# Patient Record
Sex: Female | Born: 1955 | Race: White | Hispanic: No | Marital: Married | Smoking: Never smoker
Health system: Southern US, Community
[De-identification: ages and names within clinical notes are randomized; demographics above are authoritative.]

## PROBLEM LIST (undated history)

## (undated) DIAGNOSIS — F419 Anxiety disorder, unspecified: Secondary | ICD-10-CM

## (undated) DIAGNOSIS — R739 Hyperglycemia, unspecified: Secondary | ICD-10-CM

## (undated) DIAGNOSIS — E785 Hyperlipidemia, unspecified: Secondary | ICD-10-CM

## (undated) DIAGNOSIS — I10 Essential (primary) hypertension: Secondary | ICD-10-CM

## (undated) HISTORY — DX: Anxiety disorder, unspecified: F41.9

## (undated) HISTORY — DX: Hyperlipidemia, unspecified: E78.5

## (undated) HISTORY — DX: Essential (primary) hypertension: I10

## (undated) HISTORY — DX: Hyperglycemia, unspecified: R73.9

---

## 2004-02-01 ENCOUNTER — Ambulatory Visit: Payer: Self-pay | Admitting: Family Medicine

## 2004-06-27 ENCOUNTER — Ambulatory Visit (HOSPITAL_COMMUNITY): Admission: RE | Admit: 2004-06-27 | Discharge: 2004-06-27 | Payer: Self-pay | Admitting: *Deleted

## 2004-06-27 ENCOUNTER — Encounter (INDEPENDENT_AMBULATORY_CARE_PROVIDER_SITE_OTHER): Payer: Self-pay | Admitting: *Deleted

## 2005-01-29 ENCOUNTER — Emergency Department (HOSPITAL_COMMUNITY): Admission: EM | Admit: 2005-01-29 | Discharge: 2005-01-29 | Payer: Self-pay | Admitting: Emergency Medicine

## 2005-02-19 ENCOUNTER — Ambulatory Visit: Payer: Self-pay | Admitting: Family Medicine

## 2006-03-23 ENCOUNTER — Ambulatory Visit: Payer: Self-pay

## 2006-10-13 ENCOUNTER — Ambulatory Visit: Payer: Self-pay | Admitting: Family Medicine

## 2006-11-03 ENCOUNTER — Ambulatory Visit: Payer: Self-pay | Admitting: Family Medicine

## 2006-11-30 DIAGNOSIS — N61 Mastitis without abscess: Secondary | ICD-10-CM | POA: Insufficient documentation

## 2006-12-09 ENCOUNTER — Encounter: Admission: RE | Admit: 2006-12-09 | Discharge: 2006-12-09 | Payer: Self-pay | Admitting: Obstetrics

## 2006-12-17 ENCOUNTER — Ambulatory Visit: Payer: Self-pay | Admitting: Family Medicine

## 2007-05-25 ENCOUNTER — Ambulatory Visit (HOSPITAL_COMMUNITY): Admission: RE | Admit: 2007-05-25 | Discharge: 2007-05-26 | Payer: Self-pay | Admitting: Obstetrics

## 2007-05-25 ENCOUNTER — Encounter (INDEPENDENT_AMBULATORY_CARE_PROVIDER_SITE_OTHER): Payer: Self-pay | Admitting: Obstetrics

## 2007-05-26 ENCOUNTER — Encounter (INDEPENDENT_AMBULATORY_CARE_PROVIDER_SITE_OTHER): Payer: Self-pay | Admitting: Obstetrics

## 2007-11-16 DIAGNOSIS — E1169 Type 2 diabetes mellitus with other specified complication: Secondary | ICD-10-CM | POA: Insufficient documentation

## 2007-11-16 DIAGNOSIS — E785 Hyperlipidemia, unspecified: Secondary | ICD-10-CM | POA: Insufficient documentation

## 2008-04-06 HISTORY — PX: ABDOMINAL HYSTERECTOMY: SHX81

## 2008-11-02 DIAGNOSIS — I152 Hypertension secondary to endocrine disorders: Secondary | ICD-10-CM | POA: Insufficient documentation

## 2008-11-05 DIAGNOSIS — F430 Acute stress reaction: Secondary | ICD-10-CM | POA: Insufficient documentation

## 2010-08-19 NOTE — Op Note (Signed)
NAMEAIRIS, Monica Hoffman               ACCOUNT NO.:  [id number]   MEDICAL RECORD NO.:  [id number]          PATIENT TYPE:  AMB   LOCATION:  SDC                           FACILITY:  [facility]   PHYSICIAN:  Lendon Colonel, MD   DATE OF BIRTH:  10-Sep-1955   DATE OF PROCEDURE:  05/25/2007  DATE OF DISCHARGE:                               OPERATIVE REPORT   PREOPERATIVE DIAGNOSES:  1. Pelvic pain.  2. Hematometra.  3. Hematosalpinx.   POSTOPERATIVE DIAGNOSES:  1. Pelvic pain.  2. Hematometra.  3. Hematosalpinx.   PROCEDURE:  Laparoscopic supracervical hysterectomy, bilateral  salpingectomy, lysis of adhesions.   SURGEON:  Dr. Lendon Colonel.   ASSISTANT:  Dr. Earlene Plater.   ANESTHESIA:  General.   FINDINGS:  Bilateral adnexa adhered to pelvic sidewall, cervical  stenosis, blood-filled uterus, bilateral hematosalpinx, normal ovaries.   SPECIMENS:  Uterus, bilateral fallopian tubes.   DISPOSITION:  To pathology.   ESTIMATED BLOOD LOSS:  100 mL.   COMPLICATIONS:  None.   DESCRIPTION OF PROCEDURE:  After informed consent was obtained, the  patient was taken to the operating room where general anesthesia was  initiated with difficulty, prepped and draped in the normal sterile  fashion in the dorsal spine lithotomy position. A Foley catheter was  inserted into the bladder. Attempts at placing uterine manipulators was  unsuccessful secondary to dense adhesions at the internal cervical os.  Approximately 5 minutes were spent in attempting to dilate the cervix  however, this was unsuccessful. The decision was made to proceed with  the surgery without uterine manipulator. We also decided to delay the  decision to  proceed with either  a laparoscopic supracervical  hysterectomy versus total laparoscopic hysterectomy further into the  case and anatomy could be better assessed. Gloves were changed. The 1%  lidocaine was infused in the umbilicus and a 10-mm skin incision was  made with the  scalpel. This was carried through to the underlying layer  of fascia. The fascia was grasped with Kochers x2 and entered with the  Metzenbaums. The Hasson trocar was inserted under direct visualization  into the peritoneal cavity. The uterine peritoneum was insufflated with  CO2 gas to 15 mmHg. A 5-mm skin incision was made in the right lower  quadrant and a 10-mm skin incision was made in the left lower quadrant  with first infusing 1% lidocaine. Trocar placement was made under direct  visualization with the laparoscope. Blunt and grasping probes were used  to aid in visualization of the patient's abdomen and pelvis. Of note, a  normal appendix was noted, normal liver edge, normal gallbladder, no  bowel pathology. Large bilateral distended fallopian tubes were noted  with normal ovaries. The uterus was noted to be adhered to the posterior  cul-de-sac and the bilateral adnexa adhered to the pelvic sidewall.  Monopolar scissors were used to free the adhesions of the uterus to the  posterior cul-de-sac. A decision was made to first proceed with the  right salpingectomy given the degree of tubal distention which was  limiting visualization of the pelvis. The tripolar device was  used to  serially cauterize and transect the right tube from its attachment to  the uterus and to the right ovary. Good hemostasis was noted at that  area. The left round ligament was grasped with a tripolar, transected  and dissection was carried along the anterior leaf of the broad ligament  until a bladder flap was created. Posterior dissection was done as well.  The fallopian tube was transected close to the left cornua and the plan  was to remove the left tube at the end of the procedure. The right round  ligament was grasped and serially transected with the tripolar cautery.  The anterior leaf of the broad ligament was dissected and carried down  to the bladder flap and posterior dissection was done. The left  uterine  artery was dissected out inferiorly, cauterized and transected with the  bipolar s. The procedure was carried down on the right uterine artery.  Dissection down the cardinal ligament was done with the tripolar and the  bladder was cleared with monopolar scissors. Once the uterus was noted  to have blanched and all blood supply was secure, the monopolar scissors  were used to cauterize across the mid portion of the cervix. Good  hemostasis was noted and the uterus was free. The monopolar scissors  were then used in the left pelvic sidewall to free the tube and ovary  which was adhesed to the pelvic sidewall. During this process, the  peritoneal inclusion cysts were ruptured. The left tube was serially  dissected off with the tripolar and freed, good hemostasis was noted.  Once all the specimens were freed and their vascular supply was  controlled, the morcellator was placed in the left side port and the  uterus and bilateral tubes were morcellated. Of note during  morcellation, both hematosalpinx and hematometra were noted. Some  additional bleeding was noted at the right apex of the cervical cuff and  this was controlled with tripolar cautery. Once hemostatic, the pelvis  was irrigated, all pedicles were noted to be free. The morcellator was  removed from the abdomen, the 11 mm left side port was closed with the  laparoscopic fascial closure device with the #0 Vicryl suture. The skin  was closed with 4-0 Vicryl and Dermabond. The patient was awoken from  general anesthesia having tolerated the procedure well.      Lendon Colonel, MD  Electronically Signed     KAF/MEDQ  D:  05/25/2007  T:  05/26/2007  Job:  [id number]

## 2010-08-22 NOTE — Consult Note (Signed)
Monica Hoffman, Monica Hoffman               ACCOUNT NO.:  [id number]   MEDICAL RECORD NO.:  [id number]          PATIENT TYPE:  EMS   LOCATION:  MAJO                         FACILITY:  [facility]   PHYSICIAN:  Genene Churn. Love, M.D.    DATE OF BIRTH:  October 14, 1955   DATE OF CONSULTATION:  01/29/2005  DATE OF DISCHARGE:                                   CONSULTATION   REASON FOR CONSULTATION:  This 55 year old left hand white married female is  seen at the request of Dr. Jacinto Halim for evaluation of numbness.   HISTORY OF PRESENT ILLNESS:  [name] has a past history of D&C in April  2006 treated with hormone replacement therapy following the D&C.  She has no  known history of hypertension, diabetes, or heart disease.  This day, she  awoke in the morning with jaw pain and left shoulder pain followed by  numbness of the left hand and left foot.  This lasted approximately three  hours.  There was no associated headache, syncope, or seizure.  She has no  known history of hypertension, but was found to be hypertensive when she  came to the emergency room  with diastolics in the 116 to 96 range.  She has  been on birth control pills but has no other real risk factors.  She does  not smoke cigarettes.  Her migraine headaches in the past have decreased in  frequency, now occurring about once per month.  They are not associated with  numbness but are associated with photophobia and phonophobia.  She does not  drink alcohol.  She has no history of head or neck trauma.   CURRENT MEDICATIONS:  Birth control pills.   SOCIAL HISTORY:  She works as a Teacher, adult education and she has had some neck  pain in the past radiating to her left arm.   PHYSICAL EXAMINATION:  GENERAL:  Well developed pleasant white female in no acute distress.  VITAL SIGNS:  Blood pressure in right and left arm 140/80, heart rate 84 and  regular, there were no bruits.  NECK:  Flexion and extension maneuvers were unremarkable.  NEUROLOGICAL:   Mental status exam reveals she is alert ands oriented x 3,  she followed one, two, and three step commands.  Cranial nerve examination  reveals visual fields to be full, both discs were seen and flat, the  extraocular movements are full, corneals are present, there was no seventh  nerve palsy.  The tongue was midline, uvula midline, gag was present.  Sternocleidomastoid and trapezius testing were intact.  Motor examination  revealed 5/5 strength in the upper and lower extremities.  Coordination  testing was normal.  Sensory examination was intact to pin prick, light  touch, vibration testing.  Deep tendon reflexes were 1-2+ and plantar  responses were downgoing.   The patient has had two sets of cardiac isoenzymes which are unremarkable  and has had an MRI study of the brain with and without contrast enhancement  which is unremarkable and normal intracranial MRA.   IMPRESSION:  Numbness, 782.0, possibly representing TIA.   PLAN:  Recommend further TIA workup, I discussed with the patient which she  wishes apparently to do as an outpatient.  After talking with Dr. Jacinto Halim, she  will have a stress test, Doppler study, she will gon one one aspirin per day  and discontinue hormone therapy.           ______________________________  Genene Churn. Sandria Manly, M.D.     JML/MEDQ  D:  01/29/2005  T:  01/30/2005  Job:  [id number]

## 2010-08-22 NOTE — Op Note (Signed)
[name], Hoffman               ACCOUNT NO.:  [id number]   MEDICAL RECORD NO.:  [id number]          PATIENT TYPE:  AMB   LOCATION:  SDC                           [facility]   PHYSICIAN:  Pershing Cox, M.D.DATE OF BIRTH:  1955/06/22   DATE OF PROCEDURE:  06/27/2004  DATE OF DISCHARGE:                                 OPERATIVE REPORT   PREOPERATIVE DIAGNOSES:  1.  Menorrhagia.  2.  Intraluminal myoma.   POSTOPERATIVE DIAGNOSES:  1.  Menorrhagia.  2.  Intraluminal uterine myoma.  3.  Endometrial polyps.   PROCEDURES:  1.  Examination under anesthesia.  2.  Fractional dilatation and curettage.  3.  Hysteroscopy with resection of intraluminal myoma and two endometrial      polyps.  4.  NovaSure endometrial ablation.   SURGEON:  Pershing Cox, M.D.   ASSISTANT:  None.   ANESTHESIA:  General by LMA and local paracervical block.   INDICATIONS FOR PROCEDURE:  Monica Hoffman has bee followed in our office for  gynecologic care.  When seen in September, she was having some left lower  quadrant pain and bleeding almost constantly since June.  She has been  placed on oral contraceptives by Dr. Sullivan Lone in [place].  She came to see  [place] and an ultrasound was performed.  This ultrasound showed uterine myomas  but specifically one intraluminal or submucosal myoma measuring  approximately 1.7 x 1.5 x 1.25.  He wanted to begin on oral contraceptives  to see if this would stop her bleeding.  She was given information regarding  the need for resection at that time.  She has been on [place]  contraceptive but she had had lots of bleeding and decided in February to  proceed with resection of this intraluminal myoma and, if possible,  experience an endometrial ablation.  She is here today for this procedure.   OPERATIVE FINDINGS:  Exam under anesthesia shows the uterus to be  retroverted and the fibroids our actually palpable as a firm surface.  There  are no adnexal  masses.  The cervix sounded to 4 cm.  The endometrial canal  sounded to 8 cm.  There was a 2 cm discrete myoma from the right anterior  endometrium and a small polyp in the left cornua.  In addition, there was a  bloody polyp in the distal or lower uterine segment and a very slender polyp  which was see arising from the top of the endocervix.  At the end of the  procedure, the endometrial cavity had been coagulated at the fundus.   PROCEDURE:  Monica Hoffman was brought to the operating room with an IV in  place.  She had received a gram of Ancef at the holding area.  Supine on the  OR table, general anesthesia was established by IV sedation and then  placement of an LMA mask.  The patient was placed into Allen stirrups and  positioned for the procedure.  Betadine was used to prep the anterior  abdominal wall, upper thighs, perineum and vagina.  The patient was draped for a sterile  vaginal procedure.   Bivalve speculum was inserted into the vagina.  The cervix was visualized  and grasped with a single-tooth tenaculum.  Marcaine paracervical block was  administered at the 3, 4, 7 and 8 position and then on the anterior cervix  beneath the tenaculum.  Endocervical curettings were obtained onto a Telfa.  The Hegar dilator was then used to sound the endocervical canal to 4 cm.  A  uterine sound passed to a depth of 8 cm and these measurements were then  used for the NovaSure ablation.  Serial Pratt dilators were used to dilate  the cervix to size 35 Pratt dilator.  I was only able to dilate to size 31  and then placed another single-tooth tenaculum on the posterior cervix and  was able to further dilate size 35. Using through-and-through sorbitol  irrigation, the endometrial cavity was visualized and photographed. The mass  which was seen in the right cornu was photographed.  Using the resectoscope,  the endometrial polyps were resected.  A forceps was placed into the cavity  to try and remove  them.  Using this, the intraluminal myoma was actually  removed.  The resectoscope was reintroduced and the base of the myoma as  well as the bases of the polyps were excised.  The more distal slender  endocervical polyp was also resected.  In order to distend the endometrial  cavity, the pressure had to be increased to 100 with sorbitol irrigation.  The cavity was visualized and there were no additional abnormalities.   A decision was made to proceed with the NovaSure endometrial ablation.  Saline was attached to the resectoscope and 30 seconds of through-and-  through irrigation were performed to remove the sorbitol in the cavity.   Using the previously-determined cervical depth and endometrial depth, the  cavity was set at 4 cm.  Holding the front of the NovaSure instrument, it was advanced into the  cavity and then backed out 2 cm.  The handle was closed to position the  array and locked.  The device was seated by using a push-pull, up-down, left-  right and then rotational procedure.  The width the array was 3 cm.  The  tenaculum was held throughout the procedure.  Once the device had been  seated and the setting set to 3 on the device, the collar was situated next  to the cervix and we tested to be sure there was no evidence of perforation.  Once the test had been completed and satisfactory, the ablation procedure  was performed.  It took 1 minute 30 seconds to complete this procedure at a  power of 66.  The color was pulled back, the array was separated and  determined that the array was closed.  It was removed.   The hysteroscope was introduced into the cavity and using saline irrigation,  I was able to see that the upper fundus had been ablated.  With this  information, the procedure was terminated after the sound passed again only  to 8 cm.      MAJ/MEDQ  D:  06/27/2004  T:  06/27/2004  Job:  [id number]   cc:   Julieanne Manson  [address], [address]  [place]  [address]  Fax: [phone]

## 2010-12-26 LAB — TYPE AND SCREEN
ABO/RH(D): B POS
Antibody Screen: NEGATIVE

## 2010-12-26 LAB — CBC
HCT: 33.8 — ABNORMAL LOW
HCT: 42.5
Hemoglobin: 11.8 — ABNORMAL LOW
Hemoglobin: 14.5
MCHC: 34.2
MCHC: 34.9
MCV: 87
MCV: 87.5
Platelets: 186
Platelets: 218
RBC: 3.89
RBC: 4.86
RDW: 13.5
RDW: 13.7
WBC: 14.2 — ABNORMAL HIGH
WBC: 9

## 2010-12-26 LAB — PREGNANCY, URINE: Preg Test, Ur: NEGATIVE

## 2010-12-26 LAB — BASIC METABOLIC PANEL
BUN: 10
CO2: 27
Calcium: 9.6
Chloride: 101
Creatinine, Ser: 0.84
GFR calc Af Amer: >60
GFR calc non Af Amer: >60
Glucose, Bld: 101 — ABNORMAL HIGH
Potassium: 3.2 — ABNORMAL LOW
Sodium: 137

## 2010-12-26 LAB — ABO/RH: ABO/RH(D): B POS

## 2014-10-29 ENCOUNTER — Other Ambulatory Visit: Payer: Self-pay | Admitting: Family Medicine

## 2014-10-29 DIAGNOSIS — I10 Essential (primary) hypertension: Secondary | ICD-10-CM

## 2014-10-29 MED ORDER — VERAPAMIL HCL ER 240 MG PO TBCR: 240.0000 mg | EXTENDED_RELEASE_TABLET | Freq: Every day | ORAL | Status: DC

## 2015-05-01 ENCOUNTER — Other Ambulatory Visit: Payer: Self-pay | Admitting: Family Medicine

## 2015-05-01 DIAGNOSIS — I10 Essential (primary) hypertension: Secondary | ICD-10-CM

## 2015-05-01 MED ORDER — VERAPAMIL HCL ER 240 MG PO TBCR: 240.0000 mg | EXTENDED_RELEASE_TABLET | Freq: Every day | ORAL | Status: DC

## 2015-05-29 ENCOUNTER — Other Ambulatory Visit: Payer: Self-pay | Admitting: Family Medicine

## 2015-05-29 DIAGNOSIS — I10 Essential (primary) hypertension: Secondary | ICD-10-CM

## 2015-05-29 MED ORDER — VERAPAMIL HCL ER 240 MG PO TBCR: 240.0000 mg | EXTENDED_RELEASE_TABLET | Freq: Every day | ORAL | Status: DC

## 2015-06-28 ENCOUNTER — Other Ambulatory Visit: Payer: Self-pay | Admitting: Family Medicine

## 2015-06-28 DIAGNOSIS — I10 Essential (primary) hypertension: Secondary | ICD-10-CM

## 2015-06-28 MED ORDER — VERAPAMIL HCL ER 240 MG PO TBCR: 240.0000 mg | EXTENDED_RELEASE_TABLET | Freq: Every day | ORAL | Status: DC

## 2015-07-01 ENCOUNTER — Telehealth: Payer: Self-pay | Admitting: Family Medicine

## 2015-07-01 NOTE — Telephone Encounter (Signed)
Let patient know Nadine CountsBob sent refill of Calan-SR (verapamil) to the pharmacy on 06-28-15. Should check to see if pharmacy received it. Since he has not seen this patient in over a year, he may need specific tests that he will order during office visit.

## 2015-07-01 NOTE — Telephone Encounter (Signed)
Advised patient as below.  

## 2015-07-01 NOTE — Telephone Encounter (Signed)
Patient is requesting enough refill for verapamil (CALAN-SR) 240 MG CR tablet until her appt with Nadine CountsBob on April 4  [facility]  Cb# [phone]  Also wanted to know if you will order bloodwork and when she come in results will be back

## 2015-07-08 ENCOUNTER — Ambulatory Visit
Admission: RE | Admit: 2015-07-08 | Discharge: 2015-07-08 | Disposition: A | Discharge status: Home / Self Care | Payer: BLUE CROSS/BLUE SHIELD | Source: Ambulatory Visit | Attending: Family Medicine | Admitting: Family Medicine

## 2015-07-08 ENCOUNTER — Ambulatory Visit (INDEPENDENT_AMBULATORY_CARE_PROVIDER_SITE_OTHER): Payer: BLUE CROSS/BLUE SHIELD | Admitting: Family Medicine

## 2015-07-08 ENCOUNTER — Encounter: Payer: Self-pay | Admitting: Family Medicine

## 2015-07-08 VITALS — BP 132/88 | HR 76 | Temp 98.0°F | Resp 16 | Ht 68.0 in | Wt 212.2 lb

## 2015-07-08 DIAGNOSIS — M25531 Pain in right wrist: Secondary | ICD-10-CM

## 2015-07-08 DIAGNOSIS — E119 Type 2 diabetes mellitus without complications: Secondary | ICD-10-CM | POA: Insufficient documentation

## 2015-07-08 DIAGNOSIS — M542 Cervicalgia: Secondary | ICD-10-CM | POA: Diagnosis not present

## 2015-07-08 DIAGNOSIS — R739 Hyperglycemia, unspecified: Secondary | ICD-10-CM | POA: Insufficient documentation

## 2015-07-08 DIAGNOSIS — E785 Hyperlipidemia, unspecified: Secondary | ICD-10-CM | POA: Diagnosis not present

## 2015-07-08 DIAGNOSIS — M25519 Pain in unspecified shoulder: Secondary | ICD-10-CM | POA: Diagnosis present

## 2015-07-08 DIAGNOSIS — I10 Essential (primary) hypertension: Secondary | ICD-10-CM

## 2015-07-08 DIAGNOSIS — F419 Anxiety disorder, unspecified: Secondary | ICD-10-CM | POA: Insufficient documentation

## 2015-07-08 DIAGNOSIS — M47892 Other spondylosis, cervical region: Secondary | ICD-10-CM | POA: Diagnosis not present

## 2015-07-08 DIAGNOSIS — H6980 Other specified disorders of Eustachian tube, unspecified ear: Secondary | ICD-10-CM | POA: Insufficient documentation

## 2015-07-08 DIAGNOSIS — S199XXA Unspecified injury of neck, initial encounter: Secondary | ICD-10-CM | POA: Diagnosis not present

## 2015-07-08 MED ORDER — CLONAZEPAM 0.5 MG PO TABS: ORAL_TABLET | ORAL | Status: DC

## 2015-07-08 NOTE — Progress Notes (Signed)
Subjective:     Patient ID: Monica Hoffman, female   DOB: 05/03/1955, 60 y.o.   MRN: [id number]  HPI  Chief Complaint  Patient presents with  . Hypertension    Patient comes into office today for follow up, last office visit was 04/23/14. At last visit patient was advised to continue Verapamil HCI ER 240mg , patient reports good compliance and tolerance while on medication  . Hyperlipidemia    Follow up from 04/23/14  . Fall    Patient reports that she slipped and had landed on her right arm. Patient reports incident happened in Feb. but has had constant pain in arm, starting  at wrist and radiating up to neck. Patient describes pain as a shooting pain.  Also wishes to renew clonazepam for occasional anxiety-did not get prior rx filled last year. States she has fallen twice over the last few months after tripping. Once on her right shoulder and last on her outstretched wrist. Reports persistent wrist pain as well as intermittent numbness in her right 3-5 fingers. Has persistent right sided neck pain which prohibits her from sleeping comfortably.   Review of Systems  Constitutional:       Encouraged her to schedule a mammogram and to walk 30 minutes daily.  Respiratory: Negative for shortness of breath.   Cardiovascular: Negative for chest pain and palpitations.  Neurological: Negative for light-headedness.       Objective:   Physical Exam  Constitutional: She appears well-developed and well-nourished. No distress.  Cardiovascular: Normal rate and regular rhythm.   Pulmonary/Chest: Breath sounds normal.  Musculoskeletal: She exhibits no edema (of lower extremities).  Cervical and shoulder with FROM. Localizes tenderness to her right posterior cervical area. Strength in right shoulder/upper extremity/EF/EE/WF/WE: 5/5. Mild tenderness over her right posterior wrist.       Assessment:    1. Essential (primary) hypertension - Comprehensive metabolic panel  2. HLD (hyperlipidemia) -  Lipid panel  3. Wrist pain, acute, right - DG Wrist Complete Right; Future  4. Cervical pain (neck) - DG Cervical Spine Complete; Future  5. Anxiety - clonazePAM (KLONOPIN) 0.5 MG tablet; 1/2 to one twice daily as needed for anxiety  Dispense: 20 tablet; Refill: 0    Plan:    Discussed use of nsaid's. Further f/u pending lab/x-ray results.

## 2015-07-08 NOTE — Patient Instructions (Signed)
We will call you with lab and x-ray results. Consider two Aleve twice daily with food for your pain. Call [facility] and schedule a mammogram.

## 2015-07-09 ENCOUNTER — Telehealth: Payer: Self-pay

## 2015-07-09 NOTE — Telephone Encounter (Signed)
Left message to call back  

## 2015-07-09 NOTE — Telephone Encounter (Signed)
-----   Message from Anola Gurneyobert Chauvin, [place] sent at 07/09/2015  7:47 AM EDT ----- No sign of fracture. Try Aleve as discussed. 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

## 2015-07-10 NOTE — Telephone Encounter (Signed)
Attempted to contact patient. No answer and unable to leave a message due to voicemail being full.  

## 2015-07-11 DIAGNOSIS — E785 Hyperlipidemia, unspecified: Secondary | ICD-10-CM | POA: Diagnosis not present

## 2015-07-11 DIAGNOSIS — I10 Essential (primary) hypertension: Secondary | ICD-10-CM | POA: Diagnosis not present

## 2015-07-11 NOTE — Telephone Encounter (Signed)
Patient has been advised. KW 

## 2015-07-12 ENCOUNTER — Telehealth: Payer: Self-pay

## 2015-07-12 LAB — LIPID PANEL
Chol/HDL Ratio: 5 ratio units — ABNORMAL HIGH (ref 0.0–4.4)
Cholesterol, Total: 227 mg/dL — ABNORMAL HIGH (ref 100–199)
HDL: 45 mg/dL (ref >39)
LDL Calculated: 136 mg/dL — ABNORMAL HIGH (ref 0–99)
Triglycerides: 228 mg/dL — ABNORMAL HIGH (ref 0–149)
VLDL Cholesterol Cal: 46 mg/dL — ABNORMAL HIGH (ref 5–40)

## 2015-07-12 LAB — COMPREHENSIVE METABOLIC PANEL
ALT: 17 IU/L (ref 0–32)
AST: 12 IU/L (ref 0–40)
Albumin/Globulin Ratio: 2 (ref 1.2–2.2)
Albumin: 4.6 g/dL (ref 3.5–5.5)
Alkaline Phosphatase: 82 IU/L (ref 39–117)
BUN/Creatinine Ratio: 12 (ref 9–23)
BUN: 12 mg/dL (ref 6–24)
Bilirubin Total: 0.3 mg/dL (ref 0.0–1.2)
CO2: 23 mmol/L (ref 18–29)
Calcium: 9.9 mg/dL (ref 8.7–10.2)
Chloride: 104 mmol/L (ref 96–106)
Creatinine, Ser: 0.99 mg/dL (ref 0.57–1.00)
GFR calc Af Amer: 73 mL/min/{1.73_m2} (ref >59)
GFR calc non Af Amer: 63 mL/min/{1.73_m2} (ref >59)
Globulin, Total: 2.3 g/dL (ref 1.5–4.5)
Glucose: 111 mg/dL — ABNORMAL HIGH (ref 65–99)
Potassium: 5.1 mmol/L (ref 3.5–5.2)
Sodium: 145 mmol/L — ABNORMAL HIGH (ref 134–144)
Total Protein: 6.9 g/dL (ref 6.0–8.5)

## 2015-07-12 NOTE — Telephone Encounter (Signed)
LMTCB ED 

## 2015-07-12 NOTE — Telephone Encounter (Signed)
-----   Message from Anola Gurneyobert Chauvin, [place] sent at 07/12/2015  7:49 AM EDT ----- Your sugar is mildly elevated in the "pre-diabetic" range. Would recommend a one-time class at [facility] about diet , exercise, and diabetes-Kat will give you the number to call. Cholesterol is mildly elevated-your calculated 10 year risk of developing cardiovascular disease is 5%. We usually recommend cholesterol lowering drugs at 7.5% risk.

## 2015-07-15 NOTE — Telephone Encounter (Signed)
Patient has been advised, information handout mailed to home. KW

## 2015-07-29 DIAGNOSIS — M9901 Segmental and somatic dysfunction of cervical region: Secondary | ICD-10-CM | POA: Diagnosis not present

## 2015-07-29 DIAGNOSIS — M5413 Radiculopathy, cervicothoracic region: Secondary | ICD-10-CM | POA: Diagnosis not present

## 2015-08-01 ENCOUNTER — Telehealth: Payer: Self-pay

## 2015-08-01 ENCOUNTER — Ambulatory Visit (INDEPENDENT_AMBULATORY_CARE_PROVIDER_SITE_OTHER): Payer: BLUE CROSS/BLUE SHIELD | Admitting: Family Medicine

## 2015-08-01 ENCOUNTER — Encounter: Payer: Self-pay | Admitting: Family Medicine

## 2015-08-01 ENCOUNTER — Ambulatory Visit
Admission: RE | Admit: 2015-08-01 | Discharge: 2015-08-01 | Disposition: A | Discharge status: Home / Self Care | Payer: BLUE CROSS/BLUE SHIELD | Source: Ambulatory Visit | Attending: Family Medicine | Admitting: Family Medicine

## 2015-08-01 VITALS — BP 122/90 | HR 87 | Temp 98.5°F | Resp 16 | Wt 212.2 lb

## 2015-08-01 DIAGNOSIS — M9901 Segmental and somatic dysfunction of cervical region: Secondary | ICD-10-CM | POA: Diagnosis not present

## 2015-08-01 DIAGNOSIS — S80211A Abrasion, right knee, initial encounter: Secondary | ICD-10-CM | POA: Diagnosis not present

## 2015-08-01 DIAGNOSIS — S8012XA Contusion of left lower leg, initial encounter: Secondary | ICD-10-CM | POA: Diagnosis not present

## 2015-08-01 DIAGNOSIS — S8991XA Unspecified injury of right lower leg, initial encounter: Secondary | ICD-10-CM | POA: Diagnosis not present

## 2015-08-01 DIAGNOSIS — X58XXXA Exposure to other specified factors, initial encounter: Secondary | ICD-10-CM | POA: Diagnosis not present

## 2015-08-01 DIAGNOSIS — F419 Anxiety disorder, unspecified: Secondary | ICD-10-CM | POA: Diagnosis not present

## 2015-08-01 DIAGNOSIS — M79662 Pain in left lower leg: Secondary | ICD-10-CM | POA: Diagnosis not present

## 2015-08-01 DIAGNOSIS — M5413 Radiculopathy, cervicothoracic region: Secondary | ICD-10-CM | POA: Diagnosis not present

## 2015-08-01 DIAGNOSIS — R938 Abnormal findings on diagnostic imaging of other specified body structures: Secondary | ICD-10-CM | POA: Diagnosis not present

## 2015-08-01 MED ORDER — CLONAZEPAM 0.5 MG PO TABS
ORAL_TABLET | ORAL | Status: DC
Start: 2015-08-01 — End: 2016-03-05

## 2015-08-01 NOTE — Patient Instructions (Signed)
Discussed continued use of ice for 24 hours. May use two Aleve twice daily with food and Tylenol up to 3000 mg/ day.

## 2015-08-01 NOTE — Telephone Encounter (Signed)
LMTCB-KW 

## 2015-08-01 NOTE — Progress Notes (Signed)
Subjective:     Patient ID: Monica Hoffman, female   DOB: 06/12/1955, 60 y.o.   MRN: [id number]  HPI  Chief Complaint  Patient presents with  . Fall    Patient states that yesterday she was outside waving to a friend when she turned around and hit and fell over potted plant. Patient states that when she fell she landed on her hands and knees. Patient complains of crampng and swelling around knee, she denies taking anything otc for pain or swelling.   States she struck her left leg against a ceramic pot and her right knee on a cement surface. Has been icing these areas since yesterday. She is concerned she may have fractures. Wishes refill on clonazepam as it helps her sleep.   Review of Systems     Objective:   Physical Exam  Constitutional: She appears well-developed and well-nourished. She appears distressed (mild pain and anxiety).  Musculoskeletal:  Bilateral knees with FROM without discomfort. Left KF/KE 5/5. R patella non-tender and mobile.  Skin:  Right knee in interior lateral aspect there is an abrasion and moderate swelling. Left lower leg with hematoma overlying her proximal tibia.       Assessment:    1. Abrasion of knee, right, initial encounter - DG Knee Complete 4 Views Right; Future  2. Hematoma of leg, left, initial encounter - DG Tibia/Fibula Left; Future  3. Anxiety - clonazePAM (KLONOPIN) 0.5 MG tablet; 1/2 to one twice daily as needed for anxiety  Dispense: 30 tablet; Refill: 5    Plan:    Discussed use of otc pain relievers and continued use of cold compresses. Further f/u pending x-ray results

## 2015-08-01 NOTE — Telephone Encounter (Signed)
-----   Message from Anola Gurneyobert Chauvin, [place] sent at 08/01/2015 10:56 AM EDT ----- No acute fractures. The radiologist sees a "tiny" bone density on your right knee x-ray which he believes may be chronic from an old injury. If your right knee was not bothering you prior to this injury no need to look further at this.

## 2015-08-02 NOTE — Telephone Encounter (Signed)
Patient has been advised. KW 

## 2015-08-05 ENCOUNTER — Other Ambulatory Visit: Payer: Self-pay | Admitting: Family Medicine

## 2015-08-05 DIAGNOSIS — I10 Essential (primary) hypertension: Secondary | ICD-10-CM

## 2015-08-05 DIAGNOSIS — M5413 Radiculopathy, cervicothoracic region: Secondary | ICD-10-CM | POA: Diagnosis not present

## 2015-08-05 DIAGNOSIS — M9901 Segmental and somatic dysfunction of cervical region: Secondary | ICD-10-CM | POA: Diagnosis not present

## 2015-08-05 MED ORDER — VERAPAMIL HCL ER 240 MG PO TBCR: 240.0000 mg | EXTENDED_RELEASE_TABLET | Freq: Every day | ORAL | Status: DC

## 2016-01-06 ENCOUNTER — Encounter: Payer: Self-pay | Admitting: Family Medicine

## 2016-01-06 ENCOUNTER — Ambulatory Visit (INDEPENDENT_AMBULATORY_CARE_PROVIDER_SITE_OTHER): Payer: BLUE CROSS/BLUE SHIELD | Admitting: Family Medicine

## 2016-01-06 VITALS — BP 122/75 | HR 88 | Temp 98.2°F | Resp 16 | Wt 213.0 lb

## 2016-01-06 DIAGNOSIS — Z23 Encounter for immunization: Secondary | ICD-10-CM

## 2016-01-06 DIAGNOSIS — H6123 Impacted cerumen, bilateral: Secondary | ICD-10-CM | POA: Diagnosis not present

## 2016-01-06 NOTE — Patient Instructions (Signed)
Minimize use of Q-tips.

## 2016-01-06 NOTE — Progress Notes (Signed)
Subjective:     Patient ID: Gaynelle AduNancy J Shreffler, female   DOB: 09/20/1955, 60 y.o.   MRN: [id number]  HPI  Chief Complaint  Patient presents with  . Ear Pain    Patient comes in office today with concerns of decreased hearing and bilateral ear pain for >2 weeks. Patient reports that pain initally began after one night of bathing, patient states that she had placed her head underwater in tub. Associated with ear ache patient complains of frequent headaches.   She has tried vinegar and H2O2 irrigations with little improvement.   Review of Systems     Objective:   Physical Exam  Constitutional: She appears well-developed and well-nourished. No distress.  HENT:  Bilateral cerumen impactions. No tragal tenderness. After irrigation per Kat: ear canals are patent and T.M's are not inflamed.       Assessment:    1. Bilateral impacted cerumen - EAR CERUMEN REMOVAL  2. Need for influenza vaccination - Flu Vaccine QUAD 36+ mos IM    Plan:    Minimize use of Q-tips.

## 2016-03-05 ENCOUNTER — Other Ambulatory Visit: Payer: Self-pay | Admitting: Family Medicine

## 2016-03-05 DIAGNOSIS — F419 Anxiety disorder, unspecified: Secondary | ICD-10-CM

## 2016-03-05 MED ORDER — CLONAZEPAM 0.5 MG PO TABS: ORAL_TABLET | ORAL | 5 refills | Status: DC

## 2016-03-06 NOTE — Progress Notes (Signed)
RX called in-aa 

## 2016-04-13 ENCOUNTER — Other Ambulatory Visit: Payer: Self-pay | Admitting: Family Medicine

## 2016-04-13 DIAGNOSIS — I10 Essential (primary) hypertension: Secondary | ICD-10-CM

## 2016-04-13 MED ORDER — VERAPAMIL HCL ER 240 MG PO TBCR: 240.0000 mg | EXTENDED_RELEASE_TABLET | Freq: Every day | ORAL | 1 refills | Status: DC

## 2016-09-13 DIAGNOSIS — R21 Rash and other nonspecific skin eruption: Secondary | ICD-10-CM | POA: Diagnosis not present

## 2016-09-13 DIAGNOSIS — R209 Unspecified disturbances of skin sensation: Secondary | ICD-10-CM | POA: Diagnosis not present

## 2016-12-14 ENCOUNTER — Other Ambulatory Visit: Payer: Self-pay | Admitting: Family Medicine

## 2016-12-14 DIAGNOSIS — F419 Anxiety disorder, unspecified: Secondary | ICD-10-CM

## 2016-12-15 NOTE — Telephone Encounter (Signed)
Called in Rx as below.  

## 2016-12-26 ENCOUNTER — Emergency Department
Admission: EM | Admit: 2016-12-26 | Discharge: 2016-12-26 | Disposition: A | Discharge status: Home / Self Care | Payer: BLUE CROSS/BLUE SHIELD | Attending: Emergency Medicine | Admitting: Emergency Medicine

## 2016-12-26 ENCOUNTER — Emergency Department: Payer: BLUE CROSS/BLUE SHIELD

## 2016-12-26 DIAGNOSIS — Z0389 Encounter for observation for other suspected diseases and conditions ruled out: Secondary | ICD-10-CM | POA: Insufficient documentation

## 2016-12-26 DIAGNOSIS — R0989 Other specified symptoms and signs involving the circulatory and respiratory systems: Secondary | ICD-10-CM | POA: Diagnosis not present

## 2016-12-26 DIAGNOSIS — E785 Hyperlipidemia, unspecified: Secondary | ICD-10-CM | POA: Insufficient documentation

## 2016-12-26 DIAGNOSIS — I10 Essential (primary) hypertension: Secondary | ICD-10-CM | POA: Insufficient documentation

## 2016-12-26 DIAGNOSIS — Z7982 Long term (current) use of aspirin: Secondary | ICD-10-CM | POA: Diagnosis not present

## 2016-12-26 DIAGNOSIS — R918 Other nonspecific abnormal finding of lung field: Secondary | ICD-10-CM | POA: Diagnosis not present

## 2016-12-26 DIAGNOSIS — Z0000 Encounter for general adult medical examination without abnormal findings: Secondary | ICD-10-CM | POA: Diagnosis not present

## 2016-12-26 DIAGNOSIS — Z139 Encounter for screening, unspecified: Secondary | ICD-10-CM

## 2016-12-26 NOTE — ED Notes (Signed)
Pt. Verbalizes understanding of d/c instructions and follow-up. VS stable and pain controlled per pt.  Pt. In NAD at time of d/c and denies further concerns regarding this visit. Pt. Stable at the time of departure from the unit, departing unit by the safest and most appropriate manner per that pt condition and limitations. Pt advised to return to the ED at any time for emergent concerns, or for new/worsening symptoms.   

## 2016-12-26 NOTE — ED Notes (Signed)
ED Provider at bedside. 

## 2016-12-26 NOTE — ED Triage Notes (Signed)
Pt reports falling asleep and waking up and wiping face and noticed nose ring was gone. Unable to locate at home. Pt reports feels like she might have swallowed. Reports possible scratchy feeling to back of throat.

## 2016-12-26 NOTE — ED Provider Notes (Signed)
[facility] Emergency Department Provider Note  ____________________________________________   First MD Initiated Contact with Patient 12/26/16 0502     (approximate)  I have reviewed the triage vital signs and the nursing notes.   HISTORY  Chief Complaint Swallowed Foreign Body   HPI Monica Hoffman is a 61 y.o. female with a history of anxiety, hypertension and hyperglycemia was presenting to the emergency department today fearing that she may have inhaled or swallowed her nose ring. Says that she fell sleep in a recliner at about 11 PM and then woke up at about 11:10 PM and the nose ring was missing. She felt that she felt scratchiness in the back of her throat and was concerned that she may have inhaled it. She denies any cough or nausea or vomiting or abdominal pain.   Past Medical History:  Diagnosis Date  . Anxiety   . Hyperglycemia   . Hyperlipidemia   . Hypertension     Patient Active Problem List   Diagnosis Date Noted  . Anxiety 07/08/2015  . Blood glucose elevated 07/08/2015  . Essential (primary) hypertension 11/02/2008  . HLD (hyperlipidemia) 11/16/2007  . Inflammatory disease of breast 11/30/2006    Past Surgical History:  Procedure Laterality Date  . ABDOMINAL HYSTERECTOMY  2010   still has ovaries    Prior to Admission medications   Medication Sig Start Date End Date Taking? Authorizing Provider  aspirin 81 MG tablet  11/16/07   [provider]  clonazePAM (KLONOPIN) 0.5 MG tablet TAKE 1/2 TO 1 TABLET BY MOUTH TWICE DAILY AS NEEDED FOR ANXIETY 12/14/16   Anola Gurney, PA  Multiple Vitamin (MULTIVITAMIN) tablet Take 1 tablet by mouth daily.    [provider]  verapamil (CALAN-SR) 240 MG CR tablet Take 1 tablet (240 mg total) by mouth at bedtime. 04/13/16   Anola Gurney, PA    Allergies Patient has no known allergies.  Family History  Problem Relation Age of Onset  . COPD Father   . Stroke Brother      Social History Social History  Substance Use Topics  . Smoking status: Never Smoker  . Smokeless tobacco: Never Used  . Alcohol use 0.0 oz/week     Comment: on occasion    Review of Systems  Constitutional: No fever/chills Eyes: No visual changes. ENT: No sore throat. Cardiovascular: Denies chest pain. Respiratory: Denies shortness of breath. Gastrointestinal: No abdominal pain.  No nausea, no vomiting.  No diarrhea.  No constipation. Genitourinary: Negative for dysuria. Musculoskeletal: Negative for back pain. Skin: Negative for rash. Neurological: Negative for headaches, focal weakness or numbness.   ____________________________________________   PHYSICAL EXAM:  VITAL SIGNS: ED Triage Vitals [12/26/16 0117]  Enc Vitals Group     BP (!) 171/107     Pulse Rate (!) 106     Resp 14     Temp (!) 97.4 F (36.3 C)     Temp Source Oral     SpO2 100 %     Weight 160 lb (72.6 kg)     Height  (1.753 m)     Head Circumference      Peak Flow      Pain Score      Pain Loc      Pain Edu?      Excl. in GC?     Constitutional: Alert and oriented. Well appearing and in no acute distress. Eyes: Conjunctivae are normal.  Head: Atraumatic. Nose: No congestion/rhinnorhea. Mouth/Throat:  Mucous membranes are moist. no foreign body visualized in the pharynx. Neck: No stridor.   Cardiovascular: Normal rate, regular rhythm. Grossly normal heart sounds.   Respiratory: Normal respiratory effort.  No retractions. Lungs CTAB. Gastrointestinal: Soft and nontender. No distention.  Musculoskeletal: No lower extremity tenderness nor edema.  No joint effusions. Neurologic:  Normal speech and language. No gross focal neurologic deficits are appreciated. Skin:  Skin is warm, dry and intact. No rash noted. Psychiatric: Mood and affect are normal. Speech and behavior are normal.  ____________________________________________   LABS (all labs ordered are listed, but only abnormal  results are displayed)  Labs Reviewed - No data to display ____________________________________________  EKG   ____________________________________________  RADIOLOGY  no foreign body visualized ____________________________________________   PROCEDURES  Procedure(s) performed:   Procedures  Critical Care performed:   ____________________________________________   INITIAL IMPRESSION / ASSESSMENT AND PLAN / ED COURSE  Pertinent labs & imaging results that were available during my care of the patient were reviewed by me and considered in my medical decision making (see chart for details).  patient without foreign body visualized on the soft tissue of the neck with a chest x-ray. She is a symptomatic at this time. I reviewed the x-rays and there does not appear to be foreign body below the diaphragm either. Even if this foreign body would've passed through the got a believe that she would have a trial of passage. She will return for any worsening or concerning symptoms. She is understanding the plan and willing to comply. Highly unlikely that she actually swallowed the foreign body.      ____________________________________________   FINAL CLINICAL IMPRESSION(S) / ED DIAGNOSES  medical screening exam.    NEW MEDICATIONS STARTED DURING THIS VISIT:  New Prescriptions   No medications on file     Note:  This document was prepared using Dragon voice recognition software and may include unintentional dictation errors.     Myrna Blazer, MD 12/26/16 [id number]

## 2016-12-26 NOTE — ED Notes (Addendum)
Pt states believing that "it is in my sinus" and "I was worried about some weird lung infection or something" d/t waking from sleep and noticing nose ring no longer in place, it is an L shaped piece of jewelry with diamond on the tip, states was "scared it broke off and may be sharp"

## 2017-01-03 ENCOUNTER — Other Ambulatory Visit: Payer: Self-pay | Admitting: Family Medicine

## 2017-01-03 DIAGNOSIS — I10 Essential (primary) hypertension: Secondary | ICD-10-CM

## 2017-01-04 NOTE — Telephone Encounter (Signed)
Need to schedule follow up of BP. Has not been seen since 01-06-2016. Will refill once to maintain dosage until follow up appointment.

## 2017-01-04 NOTE — Telephone Encounter (Signed)
Patient of Bobs please review. KW 

## 2017-01-06 NOTE — Telephone Encounter (Signed)
Left patient a message advising her.  

## 2017-02-05 ENCOUNTER — Encounter: Payer: Self-pay | Admitting: Family Medicine

## 2017-02-05 ENCOUNTER — Ambulatory Visit (INDEPENDENT_AMBULATORY_CARE_PROVIDER_SITE_OTHER): Payer: BLUE CROSS/BLUE SHIELD | Admitting: Family Medicine

## 2017-02-05 VITALS — BP 144/110 | HR 93 | Temp 98.3°F | Resp 16 | Wt 214.6 lb

## 2017-02-05 DIAGNOSIS — Z1211 Encounter for screening for malignant neoplasm of colon: Secondary | ICD-10-CM

## 2017-02-05 DIAGNOSIS — I10 Essential (primary) hypertension: Secondary | ICD-10-CM

## 2017-02-05 DIAGNOSIS — Z1239 Encounter for other screening for malignant neoplasm of breast: Secondary | ICD-10-CM

## 2017-02-05 DIAGNOSIS — Z1231 Encounter for screening mammogram for malignant neoplasm of breast: Secondary | ICD-10-CM | POA: Diagnosis not present

## 2017-02-05 DIAGNOSIS — Z23 Encounter for immunization: Secondary | ICD-10-CM | POA: Diagnosis not present

## 2017-02-05 DIAGNOSIS — E782 Mixed hyperlipidemia: Secondary | ICD-10-CM

## 2017-02-05 DIAGNOSIS — F419 Anxiety disorder, unspecified: Secondary | ICD-10-CM | POA: Diagnosis not present

## 2017-02-05 MED ORDER — VERAPAMIL HCL ER 120 MG PO TBCR: 120.0000 mg | EXTENDED_RELEASE_TABLET | Freq: Every day | ORAL | 0 refills | Status: DC

## 2017-02-05 NOTE — Patient Instructions (Addendum)
We will call you with the lab results and the mammogram scheduling (Solas may call you directly). I will work on the colon cancer screen with [facility].

## 2017-02-05 NOTE — Progress Notes (Signed)
Subjective:     Patient ID: Monica Hoffman, female   DOB: 10/16/1955, 61 y.o.   MRN: [id number]  HPI  Chief Complaint  Patient presents with  . Hypertension    Patient returns to office today for follow up patient was last seen on 07/08/15, blood pressure in house was 132/88. Patient reports that recently she has had more headaches and symptoms of fatigue. Patient reports fair compliance and good tolerance on medication.   Reports compliance with medication. Discussed health maintenance. Continues to work as a Teacher, adult educationmassage therapist.   Review of Systems  Respiratory: Negative for shortness of breath.   Cardiovascular: Negative for chest pain and palpitations.       Objective:   Physical Exam  Constitutional: She appears well-developed and well-nourished. No distress.  Cardiovascular: Normal rate and regular rhythm.   Pulmonary/Chest: Breath sounds normal.  Musculoskeletal: She exhibits no edema (of lower extremties).       Assessment:    1. Need for influenza vaccination - Flu Vaccine QUAD 36+ mos IM  2. Essential (primary) hypertension; will add additional dose to 360 mg. - verapamil (CALAN SR) 120 MG CR tablet; Take 1 tablet (120 mg total) by mouth at bedtime. Take with 240 mg.tablet  Dispense: 90 tablet; Refill: 0 - COMPLETE METABOLIC PANEL WITH GFR  3. Mixed hyperlipidemia - Lipid panel  4. Anxiety - T4, free - TSH  5. Screening for breast cancer - MM DIGITAL SCREENING BILATERAL; Future  6. Screening for colon cancer: wishes [facility]    Plan:   F/u bp in 3 weeks. Further f/u pending lab work.

## 2017-02-08 ENCOUNTER — Telehealth: Payer: Self-pay | Admitting: Family Medicine

## 2017-02-08 DIAGNOSIS — F419 Anxiety disorder, unspecified: Secondary | ICD-10-CM | POA: Diagnosis not present

## 2017-02-08 DIAGNOSIS — E782 Mixed hyperlipidemia: Secondary | ICD-10-CM | POA: Diagnosis not present

## 2017-02-08 DIAGNOSIS — Z23 Encounter for immunization: Secondary | ICD-10-CM | POA: Diagnosis not present

## 2017-02-08 LAB — LIPID PANEL
Cholesterol: 243 mg/dL — ABNORMAL HIGH (ref <200)
HDL: 43 mg/dL — ABNORMAL LOW (ref >50)
LDL Cholesterol (Calc): 159 mg/dL (calc) — ABNORMAL HIGH
Non-HDL Cholesterol (Calc): 200 mg/dL (calc) — ABNORMAL HIGH (ref <130)
Total CHOL/HDL Ratio: 5.7 (calc) — ABNORMAL HIGH (ref <5.0)
Triglycerides: 244 mg/dL — ABNORMAL HIGH (ref <150)

## 2017-02-08 LAB — COMPLETE METABOLIC PANEL WITH GFR
AG Ratio: 1.7 (calc) (ref 1.0–2.5)
ALT: 17 U/L (ref 6–29)
AST: 18 U/L (ref 10–35)
Albumin: 4.4 g/dL (ref 3.6–5.1)
Alkaline phosphatase (APISO): 74 U/L (ref 33–130)
BUN: 13 mg/dL (ref 7–25)
CO2: 28 mmol/L (ref 20–32)
Calcium: 9.7 mg/dL (ref 8.6–10.4)
Chloride: 105 mmol/L (ref 98–110)
Creat: 0.97 mg/dL (ref 0.50–0.99)
GFR, Est African American: 74 mL/min/{1.73_m2} (ref >=60)
GFR, Est Non African American: 63 mL/min/{1.73_m2} (ref >=60)
Globulin: 2.6 g/dL (calc) (ref 1.9–3.7)
Glucose, Bld: 116 mg/dL — ABNORMAL HIGH (ref 65–99)
Potassium: 4.2 mmol/L (ref 3.5–5.3)
Sodium: 143 mmol/L (ref 135–146)
Total Bilirubin: 0.5 mg/dL (ref 0.2–1.2)
Total Protein: 7 g/dL (ref 6.1–8.1)

## 2017-02-08 LAB — T4, FREE: Free T4: 1 ng/dL (ref 0.8–1.8)

## 2017-02-08 LAB — TSH: TSH: 2.36 mIU/L (ref 0.40–4.50)

## 2017-02-08 NOTE — Telephone Encounter (Signed)
Order for cologuard faxed to [facility] °

## 2017-02-09 ENCOUNTER — Telehealth: Payer: Self-pay

## 2017-02-09 NOTE — Telephone Encounter (Signed)
Patient has been advised. KW 

## 2017-02-09 NOTE — Telephone Encounter (Signed)
Pt is returning call.  CB#[id number]

## 2017-02-09 NOTE — Telephone Encounter (Signed)
-----   Message from Anola Gurneyobert Chauvin, [place] sent at 02/09/2017  7:32 AM EST ----- Mild elevations of cholesterol and sugar. Other labs ok including thyroid We will discuss more at next office visit.

## 2017-02-09 NOTE — Telephone Encounter (Signed)
[id number] 

## 2017-03-03 DIAGNOSIS — Z1212 Encounter for screening for malignant neoplasm of rectum: Secondary | ICD-10-CM | POA: Diagnosis not present

## 2017-03-03 DIAGNOSIS — Z1211 Encounter for screening for malignant neoplasm of colon: Secondary | ICD-10-CM | POA: Diagnosis not present

## 2017-03-19 ENCOUNTER — Encounter: Payer: Self-pay | Admitting: Family Medicine

## 2017-04-12 ENCOUNTER — Encounter: Payer: Self-pay | Admitting: Family Medicine

## 2017-04-12 DIAGNOSIS — N644 Mastodynia: Secondary | ICD-10-CM | POA: Diagnosis not present

## 2017-04-12 DIAGNOSIS — N6012 Diffuse cystic mastopathy of left breast: Secondary | ICD-10-CM | POA: Diagnosis not present

## 2017-04-12 DIAGNOSIS — N6011 Diffuse cystic mastopathy of right breast: Secondary | ICD-10-CM | POA: Diagnosis not present

## 2017-04-12 LAB — HM MAMMOGRAPHY

## 2017-04-19 ENCOUNTER — Other Ambulatory Visit: Payer: Self-pay | Admitting: Family Medicine

## 2017-04-19 DIAGNOSIS — I10 Essential (primary) hypertension: Secondary | ICD-10-CM

## 2017-06-03 DIAGNOSIS — L728 Other follicular cysts of the skin and subcutaneous tissue: Secondary | ICD-10-CM | POA: Diagnosis not present

## 2017-06-14 ENCOUNTER — Ambulatory Visit (INDEPENDENT_AMBULATORY_CARE_PROVIDER_SITE_OTHER): Payer: BLUE CROSS/BLUE SHIELD | Admitting: Family Medicine

## 2017-06-14 ENCOUNTER — Encounter: Payer: Self-pay | Admitting: Family Medicine

## 2017-06-14 VITALS — BP 140/96 | HR 85 | Temp 98.7°F | Resp 16

## 2017-06-14 DIAGNOSIS — I10 Essential (primary) hypertension: Secondary | ICD-10-CM | POA: Diagnosis not present

## 2017-06-14 DIAGNOSIS — J069 Acute upper respiratory infection, unspecified: Secondary | ICD-10-CM

## 2017-06-14 MED ORDER — VERAPAMIL HCL ER 120 MG PO TBCR
120.0000 mg | EXTENDED_RELEASE_TABLET | Freq: Every day | ORAL | 0 refills | Status: DC
Start: 2017-06-14 — End: 2017-09-30

## 2017-06-14 MED ORDER — HYDROCODONE-HOMATROPINE 5-1.5 MG/5ML PO SYRP: ORAL_SOLUTION | ORAL | 0 refills | Status: DC

## 2017-06-14 NOTE — Progress Notes (Signed)
Subjective:     Patient ID: Monica Hoffman, female   DOB: 05/06/1955, 62 y.o.   MRN: [id number] Chief Complaint  Patient presents with  . URI    Patient comes in office today with complaints of cold like symptoms for 8 days. Patient reports feeling achy, productive cough witrh green mucuous, shortness of breath, sore throat and chills. Patient has been taking otc Tylenol, Mucinex D, Oscillococcinum and Sambucol.    HPI States cough has been productive of greenish sputum and keeping her up at night. Also needs refill on Calan SR 120.  Review of Systems     Objective:   Physical Exam  Constitutional: She appears well-developed and well-nourished. No distress.  Ears: T.M's obscured by cerumen. Throat: no tonsillar enlargement or exudate Neck: no cervical adenopathy Lungs: clear     Assessment:    1. Viral upper respiratory tract infection: have sent in hydrocodone cough syrup  2. Essential (primary) hypertension - verapamil (CALAN SR) 120 MG CR tablet; Take 1 tablet (120 mg total) by mouth at bedtime. Take with 240 mg.tablet  Dispense: 90 tablet; Refill: 0    Plan:    Discussed use of expectorants and Delsym. To call if sputum not clearing over the course of the week.

## 2017-06-14 NOTE — Patient Instructions (Signed)
Continue Mucinex. May also try Delsym for cough.

## 2017-09-30 ENCOUNTER — Other Ambulatory Visit: Payer: Self-pay | Admitting: Family Medicine

## 2017-09-30 ENCOUNTER — Telehealth: Payer: Self-pay | Admitting: Family Medicine

## 2017-09-30 DIAGNOSIS — I10 Essential (primary) hypertension: Secondary | ICD-10-CM

## 2017-09-30 DIAGNOSIS — F419 Anxiety disorder, unspecified: Secondary | ICD-10-CM

## 2017-09-30 MED ORDER — VERAPAMIL HCL ER 240 MG PO TBCR: 240.0000 mg | EXTENDED_RELEASE_TABLET | Freq: Every day | ORAL | 1 refills | Status: DC

## 2017-09-30 MED ORDER — CLONAZEPAM 0.5 MG PO TABS: 0.2500 mg | ORAL_TABLET | Freq: Two times a day (BID) | ORAL | 0 refills | Status: DC | PRN

## 2017-09-30 MED ORDER — VERAPAMIL HCL ER 120 MG PO TBCR: 120.0000 mg | EXTENDED_RELEASE_TABLET | Freq: Every day | ORAL | 1 refills | Status: DC

## 2017-09-30 NOTE — Telephone Encounter (Signed)
Patient is due for another refill this was last prescribed 06/14/17. KW

## 2017-09-30 NOTE — Telephone Encounter (Signed)
done

## 2017-09-30 NOTE — Telephone Encounter (Signed)
[facility] faxed a refill request for the following medication. Thanks CC  verapamil (CALAN SR) 120 MG CR tablet

## 2017-12-01 ENCOUNTER — Telehealth: Payer: Self-pay | Admitting: Family Medicine

## 2017-12-01 ENCOUNTER — Other Ambulatory Visit: Payer: Self-pay | Admitting: Family Medicine

## 2017-12-01 ENCOUNTER — Ambulatory Visit (INDEPENDENT_AMBULATORY_CARE_PROVIDER_SITE_OTHER): Payer: BLUE CROSS/BLUE SHIELD | Admitting: Family Medicine

## 2017-12-01 ENCOUNTER — Encounter: Payer: Self-pay | Admitting: Family Medicine

## 2017-12-01 VITALS — BP 136/84 | HR 88 | Temp 98.4°F | Wt 220.2 lb

## 2017-12-01 DIAGNOSIS — F419 Anxiety disorder, unspecified: Secondary | ICD-10-CM

## 2017-12-01 DIAGNOSIS — R300 Dysuria: Secondary | ICD-10-CM | POA: Diagnosis not present

## 2017-12-01 DIAGNOSIS — I10 Essential (primary) hypertension: Secondary | ICD-10-CM

## 2017-12-01 DIAGNOSIS — R350 Frequency of micturition: Secondary | ICD-10-CM

## 2017-12-01 LAB — POCT URINALYSIS DIPSTICK
Bilirubin, UA: NEGATIVE
Blood, UA: NEGATIVE
Glucose, UA: NEGATIVE
Ketones, UA: NEGATIVE
Leukocytes, UA: NEGATIVE
Nitrite, UA: NEGATIVE
Protein, UA: POSITIVE — AB
Spec Grav, UA: >=1.03 — AB (ref 1.010–1.025)
Urobilinogen, UA: 0.2 E.U./dL
pH, UA: 5 (ref 5.0–8.0)

## 2017-12-01 MED ORDER — VERAPAMIL HCL ER 240 MG PO TBCR: 240.0000 mg | EXTENDED_RELEASE_TABLET | Freq: Every day | ORAL | 1 refills | Status: DC

## 2017-12-01 MED ORDER — CLONAZEPAM 0.5 MG PO TABS: 0.2500 mg | ORAL_TABLET | Freq: Two times a day (BID) | ORAL | 1 refills | Status: DC | PRN

## 2017-12-01 MED ORDER — VERAPAMIL HCL ER 120 MG PO TBCR: 120.0000 mg | EXTENDED_RELEASE_TABLET | Freq: Every day | ORAL | 1 refills | Status: DC

## 2017-12-01 NOTE — Telephone Encounter (Signed)
Last filed 09/30/17. KW

## 2017-12-01 NOTE — Progress Notes (Signed)
Patient: Monica Hoffman Female    DOB: July 06, 1955   62 y.o.   MRN: [id number] Visit Date: 12/01/2017  Today's Provider: Shirlee Latch, MD   Chief Complaint  Patient presents with  . Urinary Tract Infection   Subjective:    I, Presley Raddle, CMA, am acting as a scribe for Shirlee Latch, MD.   Urinary Tract Infection   This is a new problem. Episode onset: approximately a couple months ago  The problem occurs intermittently. The problem has been unchanged. Quality: "grabbing sensation"  The patient is experiencing no pain. There has been no fever. Associated symptoms include frequency. Associated symptoms comments: irritability and fatigue . She has tried nothing for the symptoms. There is no history of kidney stones, recurrent UTIs or a single kidney.   Has been going on for a few months.  Thinks she may be dehydarted.  HTN: - Medications: Verapamil - Compliance: good - Checking BP at home: no - Denies any SOB, CP, vision changes, LE edema, medication SEs, or symptoms of hypotension      No Known Allergies   Current Outpatient Medications:  .  aspirin 81 MG tablet, , Disp: , Rfl:  .  clonazePAM (KLONOPIN) 0.5 MG tablet, Take 0.5-1 tablets (0.25-0.5 mg total) by mouth 2 (two) times daily as needed. for anxiety, Disp: 30 tablet, Rfl: 1 .  Multiple Vitamin (MULTIVITAMIN) tablet, Take 1 tablet by mouth daily., Disp: , Rfl:  .  verapamil (CALAN SR) 120 MG CR tablet, Take 1 tablet (120 mg total) by mouth at bedtime. Take with 240 mg.tablet, Disp: 90 tablet, Rfl: 1 .  verapamil (CALAN-SR) 240 MG CR tablet, Take 1 tablet (240 mg total) by mouth at bedtime., Disp: 90 tablet, Rfl: 1  Review of Systems  Constitutional: Negative.   Respiratory: Negative.   Cardiovascular: Negative.   Genitourinary: Positive for frequency.    Social History   Tobacco Use  . Smoking status: Never Smoker  . Smokeless tobacco: Never Used  Substance Use Topics  . Alcohol use: Yes   Alcohol/week: 0.0 standard drinks    Comment: on occasion   Objective:   BP 136/84 (BP Location: Right Arm, Patient Position: Sitting, Cuff Size: Large)   Pulse 88   Temp 98.4 F (36.9 C) (Oral)   Wt 220 lb 3.2 oz (99.9 kg)   SpO2 97%   BMI 32.52 kg/m  Vitals:   12/01/17 1355  BP: 136/84  Pulse: 88  Temp: 98.4 F (36.9 C)  TempSrc: Oral  SpO2: 97%  Weight: 220 lb 3.2 oz (99.9 kg)     Physical Exam  Constitutional: She is oriented to person, place, and time. She appears well-developed and well-nourished. No distress.  HENT:  Head: Normocephalic and atraumatic.  Eyes: Conjunctivae are normal. No scleral icterus.  Cardiovascular: Normal rate, regular rhythm, normal heart sounds and intact distal pulses.  No murmur heard. Pulmonary/Chest: Effort normal and breath sounds normal. No respiratory distress. She has no wheezes. She has no rales.  Abdominal: Soft. She exhibits no distension. There is no tenderness.  Musculoskeletal: She exhibits no edema.  Neurological: She is alert and oriented to person, place, and time.  Skin: Skin is warm and dry. Capillary refill takes less than 2 seconds. No rash noted.  Psychiatric: She has a normal mood and affect. Her behavior is normal.  Vitals reviewed.   Results for orders placed or performed in visit on 12/01/17  POCT Urinalysis Dipstick  Result Value Ref Range  Color, UA dark yellow    Clarity, UA cloudy    Glucose, UA Negative Negative   Bilirubin, UA negative    Ketones, UA negative    Spec Grav, UA >=1.030 (A) 1.010 - 1.025   Blood, UA negative    pH, UA 5.0 5.0 - 8.0   Protein, UA Positive (A) Negative   Urobilinogen, UA 0.2 0.2 or 1.0 E.U./dL   Nitrite, UA negative    Leukocytes, UA Negative Negative   Appearance     Odor         Assessment & Plan:   1. Dysuria 2. Urinary frequency - chronic x months - no signs of UTI on UA today - will send Culture to confirm - suspect dehydration is contributing given high  SpecGrav - discussed hydration, AZO, and return precautions - POCT Urinalysis Dipstick - Urine Culture  3. Essential hypertension - well controlled - continue verapamil - verapamil (CALAN-SR) 240 MG CR tablet; Take 1 tablet (240 mg total) by mouth at bedtime.  Dispense: 90 tablet; Refill: 1 - verapamil (CALAN SR) 120 MG CR tablet; Take 1 tablet (120 mg total) by mouth at bedtime. Take with 240 mg.tablet  Dispense: 90 tablet; Refill: 1   Return in about 3 months (around 03/03/2018) for Physical (after 02/08/18).   The entirety of the information documented in the History of Present Illness, Review of Systems and Physical Exam were personally obtained by me. Portions of this information were initially documented by Presley RaddleNikki Walston, CMA and reviewed by me for thoroughness and accuracy.    Erasmo DownerBacigalupo, Laporchia Nakajima M, MD, MPH [facility] 12/01/2017 2:28 PM

## 2017-12-01 NOTE — Telephone Encounter (Signed)
done

## 2017-12-01 NOTE — Telephone Encounter (Signed)
[facility] faxed a refill request for the following medication. Thanks CC  clonazePAM (KLONOPIN) 0.5 MG tablet

## 2017-12-01 NOTE — Patient Instructions (Signed)

## 2017-12-04 LAB — URINE CULTURE

## 2017-12-07 ENCOUNTER — Telehealth: Payer: Self-pay

## 2017-12-07 NOTE — Telephone Encounter (Signed)
LMTCB. Lab results and result note released to MyChart also.

## 2017-12-07 NOTE — Telephone Encounter (Signed)
-----   Message from Erasmo Downer, MD sent at 12/07/2017  9:19 AM EDT ----- No true UTI on culture.  OK to stop antibiotics if still taking them  Erasmo Downer, MD, MPH [facility] 12/07/2017 9:19 AM

## 2017-12-09 NOTE — Telephone Encounter (Signed)
Patient was notified of results. Expressed understanding.  

## 2018-02-26 DIAGNOSIS — Z23 Encounter for immunization: Secondary | ICD-10-CM | POA: Diagnosis not present

## 2018-04-13 DIAGNOSIS — Z1231 Encounter for screening mammogram for malignant neoplasm of breast: Secondary | ICD-10-CM | POA: Diagnosis not present

## 2018-06-21 ENCOUNTER — Other Ambulatory Visit: Payer: Self-pay | Admitting: Family Medicine

## 2018-06-21 DIAGNOSIS — I10 Essential (primary) hypertension: Secondary | ICD-10-CM

## 2018-06-21 NOTE — Telephone Encounter (Signed)
L.O.V. 12/01/2017, please advise.

## 2018-06-21 NOTE — Telephone Encounter (Signed)
[facility] faxed refill request for the following medications:  verapamil (CALAN-SR) 240 MG CR tablet    Please advise.

## 2018-06-22 MED ORDER — VERAPAMIL HCL ER 240 MG PO TBCR: 240.0000 mg | EXTENDED_RELEASE_TABLET | Freq: Every day | ORAL | 0 refills | Status: DC

## 2018-06-22 NOTE — Telephone Encounter (Signed)
She needs f/u with new provider

## 2018-06-22 NOTE — Telephone Encounter (Signed)
Patient schedule appointment for 10/05/2018 @ 1:20 PM due to the corona virus. Patient is aware that if she may have to come in earlier than July if needs more refills on other medications.

## 2018-08-05 ENCOUNTER — Other Ambulatory Visit: Payer: Self-pay

## 2018-08-05 ENCOUNTER — Ambulatory Visit (INDEPENDENT_AMBULATORY_CARE_PROVIDER_SITE_OTHER): Payer: BLUE CROSS/BLUE SHIELD | Admitting: Physician Assistant

## 2018-08-05 ENCOUNTER — Encounter: Payer: Self-pay | Admitting: Physician Assistant

## 2018-08-05 VITALS — BP 144/97 | HR 72 | Temp 98.1°F | Resp 16 | Wt 223.0 lb

## 2018-08-05 DIAGNOSIS — E669 Obesity, unspecified: Secondary | ICD-10-CM | POA: Insufficient documentation

## 2018-08-05 DIAGNOSIS — E6609 Other obesity due to excess calories: Secondary | ICD-10-CM

## 2018-08-05 DIAGNOSIS — H6123 Impacted cerumen, bilateral: Secondary | ICD-10-CM

## 2018-08-05 DIAGNOSIS — I10 Essential (primary) hypertension: Secondary | ICD-10-CM

## 2018-08-05 DIAGNOSIS — F419 Anxiety disorder, unspecified: Secondary | ICD-10-CM | POA: Diagnosis not present

## 2018-08-05 DIAGNOSIS — S01301A Unspecified open wound of right ear, initial encounter: Secondary | ICD-10-CM

## 2018-08-05 DIAGNOSIS — Z6832 Body mass index (BMI) 32.0-32.9, adult: Secondary | ICD-10-CM

## 2018-08-05 MED ORDER — NEOMYCIN-POLYMYXIN-HC 3.5-10000-1 OT SUSP: 3.0000 [drp] | Freq: Three times a day (TID) | OTIC | 0 refills | Status: DC

## 2018-08-05 MED ORDER — CLONAZEPAM 0.5 MG PO TABS: 0.2500 mg | ORAL_TABLET | Freq: Two times a day (BID) | ORAL | 1 refills | Status: DC | PRN

## 2018-08-05 NOTE — Patient Instructions (Signed)
Earwax Buildup, Adult  The ears produce a substance called earwax that helps keep bacteria out of the ear and protects the skin in the ear canal. Occasionally, earwax can build up in the ear and cause discomfort or hearing loss.  What increases the risk?  This condition is more likely to develop in people who:  · Are female.  · Are elderly.  · Naturally produce more earwax.  · Clean their ears often with cotton swabs.  · Use earplugs often.  · Use in-ear headphones often.  · Wear hearing aids.  · Have narrow ear canals.  · Have earwax that is overly thick or sticky.  · Have eczema.  · Are dehydrated.  · Have excess hair in the ear canal.  What are the signs or symptoms?  Symptoms of this condition include:  · Reduced or muffled hearing.  · A feeling of fullness in the ear or feeling that the ear is plugged.  · Fluid coming from the ear.  · Ear pain.  · Ear itch.  · Ringing in the ear.  · Coughing.  · An obvious piece of earwax that can be seen inside the ear canal.  How is this diagnosed?  This condition may be diagnosed based on:  · Your symptoms.  · Your medical history.  · An ear exam. During the exam, your health care provider will look into your ear with an instrument called an otoscope.  You may have tests, including a hearing test.  How is this treated?  This condition may be treated by:  · Using ear drops to soften the earwax.  · Having the earwax removed by a health care provider. The health care provider may:  ? Flush the ear with water.  ? Use an instrument that has a loop on the end (curette).  ? Use a suction device.  · Surgery to remove the wax buildup. This may be done in severe cases.  Follow these instructions at home:    · Take over-the-counter and prescription medicines only as told by your health care provider.  · Do not put any objects, including cotton swabs, into your ear. You can clean the opening of your ear canal with a washcloth or facial tissue.  · Follow instructions from your health care  provider about cleaning your ears. Do not over-clean your ears.  · Drink enough fluid to keep your urine clear or pale yellow. This will help to thin the earwax.  · Keep all follow-up visits as told by your health care provider. If earwax builds up in your ears often or if you use hearing aids, consider seeing your health care provider for routine, preventive ear cleanings. Ask your health care provider how often you should schedule your cleanings.  · If you have hearing aids, clean them according to instructions from the manufacturer and your health care provider.  Contact a health care provider if:  · You have ear pain.  · You develop a fever.  · You have blood, pus, or other fluid coming from your ear.  · You have hearing loss.  · You have ringing in your ears that does not go away.  · Your symptoms do not improve with treatment.  · You feel like the room is spinning (vertigo).  Summary  · Earwax can build up in the ear and cause discomfort or hearing loss.  · The most common symptoms of this condition include reduced or muffled hearing and a feeling of   fullness in the ear or feeling that the ear is plugged.  · This condition may be diagnosed based on your symptoms, your medical history, and an ear exam.  · This condition may be treated by using ear drops to soften the earwax or by having the earwax removed by a health care provider.  · Do not put any objects, including cotton swabs, into your ear. You can clean the opening of your ear canal with a washcloth or facial tissue.  This information is not intended to replace advice given to you by your health care provider. Make sure you discuss any questions you have with your health care provider.  Document Released: 04/30/2004 Document Revised: 03/04/2017 Document Reviewed: 06/03/2016  Elsevier Interactive Patient Education © 2019 [employer]

## 2018-08-05 NOTE — Progress Notes (Signed)
Patient: Monica Hoffman Female    DOB: 09/25/1955   63 y.o.   MRN: [id number] Visit Date: 08/05/2018  Today's Provider: Margaretann LovelessJennifer M Auburn Hert, PA-C   Chief Complaint  Patient presents with  . Headache   Subjective:     Otalgia   There is pain in the right ear. This is a new problem. The current episode started today. The problem occurs constantly. There has been no fever. Associated symptoms include headaches and neck pain. Pertinent negatives include no abdominal pain, coughing, diarrhea, ear discharge, hearing loss, rash, rhinorrhea, sore throat or vomiting.   Patient states she woke up this morning with a headache and pain in her right ear. Patient states she used a Q-tip gently in right ear and saw some blood. Patient states she doesn't have any other symptoms. Her neck was bothering her this morning also but headache and neck pain have eased off now.   No Known Allergies   Current Outpatient Medications:  .  aspirin 81 MG tablet, , Disp: , Rfl:  .  clonazePAM (KLONOPIN) 0.5 MG tablet, Take 0.5-1 tablets (0.25-0.5 mg total) by mouth 2 (two) times daily as needed. for anxiety, Disp: 30 tablet, Rfl: 1 .  Multiple Vitamin (MULTIVITAMIN) tablet, Take 1 tablet by mouth daily., Disp: , Rfl:  .  verapamil (CALAN SR) 120 MG CR tablet, Take 1 tablet (120 mg total) by mouth at bedtime. Take with 240 mg.tablet, Disp: 90 tablet, Rfl: 1 .  verapamil (CALAN-SR) 240 MG CR tablet, Take 1 tablet (240 mg total) by mouth at bedtime., Disp: 90 tablet, Rfl: 0  Review of Systems  Constitutional: Negative for appetite change, chills, fatigue and fever.  HENT: Positive for ear pain. Negative for ear discharge, hearing loss, rhinorrhea and sore throat.   Respiratory: Negative for cough, chest tightness and shortness of breath.   Cardiovascular: Negative for chest pain and palpitations.  Gastrointestinal: Negative for abdominal pain, diarrhea, nausea and vomiting.  Musculoskeletal: Positive for neck  pain.  Skin: Negative for rash.  Neurological: Positive for headaches. Negative for dizziness and weakness.    Social History   Tobacco Use  . Smoking status: Never Smoker  . Smokeless tobacco: Never Used  Substance Use Topics  . Alcohol use: Yes    Alcohol/week: 0.0 standard drinks    Comment: on occasion      Objective:   BP (!) 152/108 (BP Location: Right Arm, Patient Position: Sitting, Cuff Size: Large)   Pulse 82   Temp 98.1 F (36.7 C) (Oral)   Resp 16   Wt 223 lb (101.2 kg)   SpO2 96%   BMI 32.93 kg/m  Vitals:   08/05/18 1407  BP: (!) 152/108  Pulse: 82  Resp: 16  Temp: 98.1 F (36.7 C)  TempSrc: Oral  SpO2: 96%  Weight: 223 lb (101.2 kg)     Physical Exam Vitals signs reviewed.  Constitutional:      General: She is not in acute distress.    Appearance: She is well-developed. She is not ill-appearing or diaphoretic.  HENT:     Head: Normocephalic and atraumatic.     Right Ear: Hearing, tympanic membrane, ear canal and external ear normal. Laceration and tenderness present. No middle ear effusion. There is impacted cerumen. Tympanic membrane is not injected, scarred, perforated, erythematous or bulging.     Left Ear: Hearing, tympanic membrane, ear canal and external ear normal.  No middle ear effusion. There is impacted cerumen. Tympanic  membrane is not injected, scarred, perforated, erythematous or bulging.     Ears:     Comments: Cerumen impaction noted bilaterally. Ear lavage successful. Right external canal had a small laceration in the 7-8 o'clock area    Nose: Nose normal.     Mouth/Throat:     Mouth: Mucous membranes are moist.     Pharynx: Uvula midline. No oropharyngeal exudate.  Eyes:     General: No scleral icterus.       Right eye: No discharge.        Left eye: No discharge.     Extraocular Movements: Extraocular movements intact.     Right eye: Normal extraocular motion.     Left eye: Normal extraocular motion.     Conjunctiva/sclera:  Conjunctivae normal.     Pupils: Pupils are equal, round, and reactive to light.  Neck:     Musculoskeletal: Normal range of motion and neck supple.     Thyroid: No thyromegaly.     Trachea: No tracheal deviation.  Cardiovascular:     Rate and Rhythm: Normal rate and regular rhythm.     Heart sounds: Normal heart sounds. No murmur. No friction rub. No gallop.   Pulmonary:     Effort: Pulmonary effort is normal. No respiratory distress.     Breath sounds: Normal breath sounds. No stridor. No wheezing or rales.  Lymphadenopathy:     Cervical: No cervical adenopathy.  Skin:    General: Skin is warm and dry.  Neurological:     Mental Status: She is alert and oriented to person, place, and time. Mental status is at baseline.     Cranial Nerves: No cranial nerve deficit.     Sensory: No sensory deficit.     Coordination: Coordination normal.     Gait: Gait normal.         Assessment & Plan    1. Bilateral impacted cerumen Ear lavage successful bilaterally.  2. Anxiety Stable. Diagnosis pulled for medication refill. Continue current medical treatment plan. - clonazePAM (KLONOPIN) 0.5 MG tablet; Take 0.5-1 tablets (0.25-0.5 mg total) by mouth 2 (two) times daily as needed. for anxiety  Dispense: 30 tablet; Refill: 1  3. Class 1 obesity due to excess calories with serious comorbidity and body mass index (BMI) of 32.0 to 32.9 in adult Counseled patient on healthy lifestyle modifications including dieting and exercise.   4. Essential (primary) hypertension Elevated BP today. Patient states most likely from her being in pain. Declines medications.   5. Wound, open, ear, auditory canal, right, initial encounter Small laceration found in right external canal. TM normal. Will use cortipsorin ear drops as below. Call if not improving.  - neomycin-polymyxin-hydrocortisone (CORTISPORIN) 3.5-10000-1 OTIC suspension; Place 3 drops into the right ear 3 (three) times daily. X 5-7 days   Dispense: 10 mL; Refill: 0     Margaretann Loveless, PA-C  [facility]

## 2018-09-27 ENCOUNTER — Other Ambulatory Visit: Payer: Self-pay | Admitting: Family Medicine

## 2018-09-27 DIAGNOSIS — I10 Essential (primary) hypertension: Secondary | ICD-10-CM

## 2018-10-05 ENCOUNTER — Ambulatory Visit: Payer: Self-pay | Admitting: Family Medicine

## 2018-12-13 NOTE — Progress Notes (Signed)
Patient: Monica Hoffman Female    DOB: [birth year]   63 y.o.   MRN: [id number] Visit Date: 12/14/2018  Today's Provider: Shirlee Latch, MD   Chief Complaint  Patient presents with  . Hypertension  . Possible UTI   Subjective:    HPI  Hypertension, follow-up:  BP Readings from Last 3 Encounters:  12/14/18 138/88  08/05/18 (!) 144/97  12/01/17 136/84    She was last seen for hypertension 1 years ago.  BP at that visit was 136/84. Management changes since that visit include . She reports good compliance with treatment. She is not having side effects.  She is exercising. She is adherent to low salt diet.   Outside blood pressures are not checked. She is experiencing none.  Patient denies chest pain, chest pressure/discomfort, exertional chest pressure/discomfort, fatigue, irregular heart beat and lower extremity edema.   Cardiovascular risk factors include hypertension.  Use of agents associated with hypertension: none.     Weight trend: stable Wt Readings from Last 3 Encounters:  12/14/18 224 lb 12.8 oz (102 kg)  08/05/18 223 lb (101.2 kg)  12/01/17 220 lb 3.2 oz (99.9 kg)    Current diet: well balanced  ------------------------------------------------------------------------  Patient states that she may have a UTI. Patient reports urgency to urinate, dysuria.  Feels like previous UTIs.  Reports she has not been staying as well hydrated as usual  No Known Allergies   Current Outpatient Medications:  .  aspirin 81 MG tablet, , Disp: , Rfl:  .  clonazePAM (KLONOPIN) 0.5 MG tablet, Take 0.5-1 tablets (0.25-0.5 mg total) by mouth 2 (two) times daily as needed. for anxiety, Disp: 30 tablet, Rfl: 1 .  Multiple Vitamin (MULTIVITAMIN) tablet, Take 1 tablet by mouth daily., Disp: , Rfl:  .  neomycin-polymyxin-hydrocortisone (CORTISPORIN) 3.5-10000-1 OTIC suspension, Place 3 drops into the right ear 3 (three) times daily. X 5-7 days, Disp: 10 mL, Rfl: 0 .   verapamil (CALAN-SR) 120 MG CR tablet, TAKE ONE TABLET AT BEDTIME TAKE WITH 240MG  TABLET FOR TOTAL DOSE OF 360MG  DAILY, Disp: 90 tablet, Rfl: 1 .  verapamil (CALAN-SR) 240 MG CR tablet, Take 1 tablet (240 mg total) by mouth at bedtime., Disp: 90 tablet, Rfl: 0 .  cephALEXin (KEFLEX) 500 MG capsule, Take 1 capsule (500 mg total) by mouth 2 (two) times daily for 5 days., Disp: 10 capsule, Rfl: 0  Review of Systems  Constitutional: Negative.   Respiratory: Negative.   Genitourinary: Positive for urgency.  Neurological: Negative.     Social History   Tobacco Use  . Smoking status: Never Smoker  . Smokeless tobacco: Never Used  Substance Use Topics  . Alcohol use: Yes    Alcohol/week: 0.0 standard drinks    Comment: on occasion      Objective:   BP 138/88 (BP Location: Left Arm, Patient Position: Sitting, Cuff Size: Normal)   Pulse 81   Temp (!) 96.9 F (36.1 C) (Temporal)   Wt 224 lb 12.8 oz (102 kg)   SpO2 98%   BMI 33.20 kg/m  Vitals:   12/14/18 1333  BP: 138/88  Pulse: 81  Temp: (!) 96.9 F (36.1 C)  TempSrc: Temporal  SpO2: 98%  Weight: 224 lb 12.8 oz (102 kg)  Body mass index is 33.2 kg/m.   Physical Exam Vitals signs reviewed.  Constitutional:      General: She is not in acute distress.    Appearance: She is well-developed.  HENT:  Head: Normocephalic and atraumatic.  Eyes:     General: No scleral icterus.    Conjunctiva/sclera: Conjunctivae normal.  Cardiovascular:     Rate and Rhythm: Normal rate and regular rhythm.     Pulses: Normal pulses.     Heart sounds: Normal heart sounds. No murmur.  Pulmonary:     Effort: Pulmonary effort is normal. No respiratory distress.     Breath sounds: Normal breath sounds. No wheezing or rales.  Abdominal:     General: There is no distension.     Palpations: Abdomen is soft.     Tenderness: There is abdominal tenderness in the suprapubic area. There is no right CVA tenderness, left CVA tenderness, guarding or  rebound.  Skin:    General: Skin is warm and dry.     Capillary Refill: Capillary refill takes less than 2 seconds.     Findings: No rash.  Neurological:     Mental Status: She is alert and oriented to person, place, and time. Mental status is at baseline.  Psychiatric:        Mood and Affect: Mood normal.        Behavior: Behavior normal.      Results for orders placed or performed in visit on 12/14/18  POCT urinalysis dipstick  Result Value Ref Range   Color, UA Yellow    Clarity, UA Cloudy    Glucose, UA Negative Negative   Bilirubin, UA Negative    Ketones, UA Negative    Spec Grav, UA >=1.030 (A) 1.010 - 1.025   Blood, UA Trace    pH, UA 5.0 5.0 - 8.0   Protein, UA Positive (A) Negative   Urobilinogen, UA 0.2 0.2 or 1.0 E.U./dL   Nitrite, UA Negative    Leukocytes, UA Trace (A) Negative   Appearance     Odor         Assessment & Plan   Problem List Items Addressed This Visit      Cardiovascular and Mediastinum   Essential (primary) hypertension    Well controlled Continue current medications Recheck metabolic panel F/u in 6 months       Relevant Orders   Comprehensive metabolic panel     Other   HLD (hyperlipidemia)    Not currently on a statin Recheck FLP and CMP Will calculate ASCVD risk to approximate risk and need for primary prevention      Relevant Orders   Lipid panel   Comprehensive metabolic panel   Blood glucose elevated    Noted on last labs, unclear if fasting Check A1c      Relevant Orders   Hemoglobin A1c    Other Visit Diagnoses    Cystitis with hematuria    -  Primary - Symptoms and UA consistent with UTI -No systemic symptoms or signs of pyelonephritis - Given hematuria, will send urine micro to confirm and will plan to recheck urine in about 6 weeks after completion of antibiotics to ensure hematuria has cleared -Will start treatment with 5day course of Keflex -We will send urine culture to confirm sensitivities  -Discussed return precautions    Relevant Orders   POCT urinalysis dipstick (Completed)   Urine Culture   Urine Microscopic   Need for hepatitis C screening test       Relevant Orders   Hepatitis C Antibody   Screening for HIV (human immunodeficiency virus)       Relevant Orders   HIV antibody (with reflex)  Return in about 3 months (around 03/15/2019) for CPE.   The entirety of the information documented in the History of Present Illness, Review of Systems and Physical Exam were personally obtained by me. Portions of this information were initially documented by [facility] Hospital[facility] McClurkin, CMA and reviewed by me for thoroughness and accuracy.    Bacigalupo, Marzella SchleinAngela M, MD MPH [facility]

## 2018-12-14 ENCOUNTER — Other Ambulatory Visit: Payer: Self-pay

## 2018-12-14 ENCOUNTER — Encounter: Payer: Self-pay | Admitting: Family Medicine

## 2018-12-14 ENCOUNTER — Ambulatory Visit (INDEPENDENT_AMBULATORY_CARE_PROVIDER_SITE_OTHER): Payer: BC Managed Care – PPO | Admitting: Family Medicine

## 2018-12-14 VITALS — BP 138/88 | HR 81 | Temp 96.9°F | Wt 224.8 lb

## 2018-12-14 DIAGNOSIS — I10 Essential (primary) hypertension: Secondary | ICD-10-CM | POA: Diagnosis not present

## 2018-12-14 DIAGNOSIS — R739 Hyperglycemia, unspecified: Secondary | ICD-10-CM | POA: Diagnosis not present

## 2018-12-14 DIAGNOSIS — N3091 Cystitis, unspecified with hematuria: Secondary | ICD-10-CM

## 2018-12-14 DIAGNOSIS — Z114 Encounter for screening for human immunodeficiency virus [HIV]: Secondary | ICD-10-CM

## 2018-12-14 DIAGNOSIS — E782 Mixed hyperlipidemia: Secondary | ICD-10-CM

## 2018-12-14 DIAGNOSIS — Z1159 Encounter for screening for other viral diseases: Secondary | ICD-10-CM

## 2018-12-14 LAB — POCT URINALYSIS DIPSTICK
Bilirubin, UA: NEGATIVE
Glucose, UA: NEGATIVE
Ketones, UA: NEGATIVE
Nitrite, UA: NEGATIVE
Protein, UA: POSITIVE — AB
Spec Grav, UA: >=1.03 — AB (ref 1.010–1.025)
Urobilinogen, UA: 0.2 E.U./dL
pH, UA: 5 (ref 5.0–8.0)

## 2018-12-14 MED ORDER — CEPHALEXIN 500 MG PO CAPS: 500.0000 mg | ORAL_CAPSULE | Freq: Two times a day (BID) | ORAL | 0 refills | Status: DC

## 2018-12-14 NOTE — Assessment & Plan Note (Signed)
Noted on last labs, unclear if fasting Check A1c

## 2018-12-14 NOTE — Assessment & Plan Note (Signed)
Well controlled Continue current medications Recheck metabolic panel F/u in 6 months  

## 2018-12-14 NOTE — Assessment & Plan Note (Signed)
Not currently on a statin Recheck FLP and CMP Will calculate ASCVD risk to approximate risk and need for primary prevention

## 2018-12-14 NOTE — Patient Instructions (Signed)

## 2018-12-15 ENCOUNTER — Telehealth: Payer: Self-pay

## 2018-12-15 LAB — URINALYSIS, MICROSCOPIC ONLY
Epithelial Cells (non renal): >10 /hpf — AB (ref 0–10)
RBC: >30 /hpf — AB (ref 0–2)
WBC, UA: >30 /hpf — AB (ref 0–5)

## 2018-12-15 LAB — COMPREHENSIVE METABOLIC PANEL
ALT: 16 IU/L (ref 0–32)
AST: 13 IU/L (ref 0–40)
Albumin/Globulin Ratio: 1.9 (ref 1.2–2.2)
Albumin: 4.7 g/dL (ref 3.8–4.8)
Alkaline Phosphatase: 80 IU/L (ref 39–117)
BUN/Creatinine Ratio: 14 (ref 12–28)
BUN: 16 mg/dL (ref 8–27)
Bilirubin Total: 0.2 mg/dL (ref 0.0–1.2)
CO2: 25 mmol/L (ref 20–29)
Calcium: 10.2 mg/dL (ref 8.7–10.3)
Chloride: 106 mmol/L (ref 96–106)
Creatinine, Ser: 1.16 mg/dL — ABNORMAL HIGH (ref 0.57–1.00)
GFR calc Af Amer: 58 mL/min/{1.73_m2} — ABNORMAL LOW (ref >59)
GFR calc non Af Amer: 51 mL/min/{1.73_m2} — ABNORMAL LOW (ref >59)
Globulin, Total: 2.5 g/dL (ref 1.5–4.5)
Glucose: 100 mg/dL — ABNORMAL HIGH (ref 65–99)
Potassium: 4.6 mmol/L (ref 3.5–5.2)
Sodium: 146 mmol/L — ABNORMAL HIGH (ref 134–144)
Total Protein: 7.2 g/dL (ref 6.0–8.5)

## 2018-12-15 LAB — LIPID PANEL
Chol/HDL Ratio: 5.4 ratio — ABNORMAL HIGH (ref 0.0–4.4)
Cholesterol, Total: 236 mg/dL — ABNORMAL HIGH (ref 100–199)
HDL: 44 mg/dL (ref >39)
LDL Chol Calc (NIH): 135 mg/dL — ABNORMAL HIGH (ref 0–99)
Triglycerides: 316 mg/dL — ABNORMAL HIGH (ref 0–149)
VLDL Cholesterol Cal: 57 mg/dL — ABNORMAL HIGH (ref 5–40)

## 2018-12-15 LAB — HEMOGLOBIN A1C
Est. average glucose Bld gHb Est-mCnc: 131 mg/dL
Hgb A1c MFr Bld: 6.2 % — ABNORMAL HIGH (ref 4.8–5.6)

## 2018-12-15 LAB — HIV ANTIBODY (ROUTINE TESTING W REFLEX): HIV Screen 4th Generation wRfx: NONREACTIVE

## 2018-12-15 LAB — HEPATITIS C ANTIBODY: Hep C Virus Ab: <0.1 s/co ratio (ref 0.0–0.9)

## 2018-12-15 NOTE — Telephone Encounter (Signed)
LMTCB

## 2018-12-15 NOTE — Telephone Encounter (Signed)
-----   Message from Virginia Crews, MD sent at 12/15/2018  1:21 PM EDT ----- Negative Hep C and HIV screening.  Urine and kidney function and elevated sodium show signs of dehydration.  There are WBCs and blood in the urine as we discussed with the UTI.  Would like to have f/u in 6 weeks to recheck labs and UA after finishing antibiotics to ensure clearance of hematuria.  Cholesterol is high.  10 year risk of heart disease/stroke is high at 7.8%. Recommend statin to lower this risk.  A1c remains in prediabetic range

## 2018-12-16 LAB — URINE CULTURE

## 2019-01-09 ENCOUNTER — Other Ambulatory Visit (HOSPITAL_COMMUNITY)
Admission: RE | Admit: 2019-01-09 | Discharge: 2019-01-09 | Disposition: A | Discharge status: Home / Self Care | Payer: BC Managed Care – PPO | Source: Ambulatory Visit | Attending: Family Medicine | Admitting: Family Medicine

## 2019-01-09 ENCOUNTER — Other Ambulatory Visit: Payer: Self-pay

## 2019-01-09 ENCOUNTER — Ambulatory Visit (INDEPENDENT_AMBULATORY_CARE_PROVIDER_SITE_OTHER): Payer: BC Managed Care – PPO | Admitting: Family Medicine

## 2019-01-09 ENCOUNTER — Encounter: Payer: Self-pay | Admitting: Family Medicine

## 2019-01-09 VITALS — BP 121/87 | HR 91 | Temp 97.3°F | Ht 69.0 in | Wt 224.0 lb

## 2019-01-09 DIAGNOSIS — Z124 Encounter for screening for malignant neoplasm of cervix: Secondary | ICD-10-CM

## 2019-01-09 DIAGNOSIS — Z23 Encounter for immunization: Secondary | ICD-10-CM | POA: Diagnosis not present

## 2019-01-09 DIAGNOSIS — Z1231 Encounter for screening mammogram for malignant neoplasm of breast: Secondary | ICD-10-CM

## 2019-01-09 DIAGNOSIS — Z0000 Encounter for general adult medical examination without abnormal findings: Secondary | ICD-10-CM

## 2019-01-09 NOTE — Progress Notes (Signed)
Patient: Monica Hoffman, Female    DOB: 02/24/1956, 63 y.o.   MRN: [id number] Visit Date: 01/09/2019  Today's Provider: Shirlee LatchAngela Yanelli Zapanta, MD   Chief Complaint  Patient presents with  . Annual Exam   Subjective:    I, Presley RaddleNikki Walston, CMA, am acting as a scribe for Shirlee LatchAngela Kipper Buch, MD.    Annual physical exam Monica Hoffman is a 63 y.o. female who presents today for health maintenance and complete physical. She feels well. She reports exercising 3 days per week. She reports she is sleeping well.  -----------------------------------------------------------------   Review of Systems  Constitutional: Negative.   HENT: Negative.   Eyes: Negative.   Respiratory: Negative.   Cardiovascular: Negative.   Gastrointestinal: Negative.   Endocrine: Negative.   Genitourinary: Negative.   Musculoskeletal: Negative.   Skin: Negative.   Allergic/Immunologic: Negative.   Neurological: Negative.   Hematological: Negative.   Psychiatric/Behavioral: Negative.     Social History      She  reports that she has never smoked. She has never used smokeless tobacco. She reports current alcohol use.       Social History   Socioeconomic History  . Marital status: Married    Spouse name: Not on file  . Number of children: Not on file  . Years of education: Not on file  . Highest education level: Not on file  Occupational History  . Not on file  Social Needs  . Financial resource strain: Not on file  . Food insecurity    Worry: Not on file    Inability: Not on file  . Transportation needs    Medical: Not on file    Non-medical: Not on file  Tobacco Use  . Smoking status: Never Smoker  . Smokeless tobacco: Never Used  Substance and Sexual Activity  . Alcohol use: Yes    Alcohol/week: 0.0 standard drinks    Comment: on occasion  . Drug use: Not on file  . Sexual activity: Not on file  Lifestyle  . Physical activity    Days per week: Not on file    Minutes per session: Not on  file  . Stress: Not on file  Relationships  . Social Musicianconnections    Talks on phone: Not on file    Gets together: Not on file    Attends religious service: Not on file    Active member of club or organization: Not on file    Attends meetings of clubs or organizations: Not on file    Relationship status: Not on file  Other Topics Concern  . Not on file  Social History Narrative  . Not on file    Past Medical History:  Diagnosis Date  . Anxiety   . Hyperglycemia   . Hyperlipidemia   . Hypertension      Patient Active Problem List   Diagnosis Date Noted  . Class 1 obesity due to excess calories with serious comorbidity and body mass index (BMI) of 32.0 to 32.9 in adult 08/05/2018  . Anxiety 07/08/2015  . Blood glucose elevated 07/08/2015  . Essential (primary) hypertension 11/02/2008  . HLD (hyperlipidemia) 11/16/2007  . Inflammatory disease of breast 11/30/2006    Past Surgical History:  Procedure Laterality Date  . ABDOMINAL HYSTERECTOMY  2010   still has ovaries    Family History        Family Status  Relation Name Status  . Mother  Deceased at age 62  died from brain tumor  . Father  Deceased at age 44  . Sister  Alive  . Brother  Deceased at age 53       stroke  . Sister  Alive  . Sister  Alive  . Sister  Alive  . Brother  Alive  . Brother  Alive        Her family history includes COPD in her father; Stroke in her brother.      No Known Allergies   Current Outpatient Medications:  .  aspirin 81 MG tablet, , Disp: , Rfl:  .  clonazePAM (KLONOPIN) 0.5 MG tablet, Take 0.5-1 tablets (0.25-0.5 mg total) by mouth 2 (two) times daily as needed. for anxiety, Disp: 30 tablet, Rfl: 1 .  Multiple Vitamin (MULTIVITAMIN) tablet, Take 1 tablet by mouth daily., Disp: , Rfl:  .  neomycin-polymyxin-hydrocortisone (CORTISPORIN) 3.5-10000-1 OTIC suspension, Place 3 drops into the right ear 3 (three) times daily. X 5-7 days, Disp: 10 mL, Rfl: 0 .  verapamil  (CALAN-SR) 120 MG CR tablet, TAKE ONE TABLET AT BEDTIME TAKE WITH 240MG  TABLET FOR TOTAL DOSE OF 360MG  DAILY, Disp: 90 tablet, Rfl: 1 .  verapamil (CALAN-SR) 240 MG CR tablet, Take 1 tablet (240 mg total) by mouth at bedtime., Disp: 90 tablet, Rfl: 0   Patient Care Team: Virginia Crews, MD as PCP - General (Family Medicine)    Objective:    Vitals: BP 121/87 (BP Location: Left Arm, Patient Position: Sitting, Cuff Size: Large)   Pulse 91   Temp (!) 97.3 F (36.3 C) (Temporal)   Ht 5\' 9"  (1.753 m)   Wt 224 lb (101.6 kg)   SpO2 95%   BMI 33.08 kg/m    Vitals:   01/09/19 1059  BP: 121/87  Pulse: 91  Temp: (!) 97.3 F (36.3 C)  TempSrc: Temporal  SpO2: 95%  Weight: 224 lb (101.6 kg)  Height: 5\' 9"  (1.753 m)     Physical Exam Vitals signs reviewed.  Constitutional:      General: She is not in acute distress.    Appearance: Normal appearance. She is well-developed. She is not diaphoretic.  HENT:     Head: Normocephalic and atraumatic.     Right Ear: Tympanic membrane, ear canal and external ear normal.     Left Ear: Tympanic membrane, ear canal and external ear normal.  Eyes:     General: No scleral icterus.    Conjunctiva/sclera: Conjunctivae normal.     Pupils: Pupils are equal, round, and reactive to light.  Neck:     Musculoskeletal: Neck supple.     Thyroid: No thyromegaly.  Cardiovascular:     Rate and Rhythm: Normal rate and regular rhythm.     Pulses: Normal pulses.     Heart sounds: Normal heart sounds. No murmur.  Pulmonary:     Effort: Pulmonary effort is normal. No respiratory distress.     Breath sounds: Normal breath sounds. No wheezing or rales.  Abdominal:     General: There is no distension.     Palpations: Abdomen is soft.     Tenderness: There is no abdominal tenderness.  Genitourinary:    Comments: GYN:  External genitalia within normal limits.  Vaginal mucosa pink, moist, normal rugae.  Nonfriable cervix without lesions, no discharge or  bleeding noted on speculum exam.  Bimanual exam revealed surgically absent uterus.  No cervical motion tenderness. No adnexal masses bilaterally.    Musculoskeletal:  General: No deformity.     Right lower leg: No edema.     Left lower leg: No edema.  Lymphadenopathy:     Cervical: No cervical adenopathy.  Skin:    General: Skin is warm and dry.     Capillary Refill: Capillary refill takes less than 2 seconds.     Findings: No rash.  Neurological:     Mental Status: She is alert and oriented to person, place, and time.  Psychiatric:        Mood and Affect: Mood normal.        Behavior: Behavior normal.        Thought Content: Thought content normal.      Depression Screen PHQ 2/9 Scores 01/09/2019 08/05/2018  PHQ - 2 Score 0 2  PHQ- 9 Score 1 7       Assessment & Plan:     Routine Health Maintenance and Physical Exam  Exercise Activities and Dietary recommendations Goals   None     Immunization History  Administered Date(s) Administered  . Influenza Split 03/13/2011, 12/18/2011  . Influenza,inj,Quad PF,6+ Mos 01/30/2013, 04/23/2014, 01/06/2016, 02/05/2017, 02/26/2018, 01/09/2019  . Td 01/09/2019  . Tdap 10/13/2006    Health Maintenance  Topic Date Due  . PAP SMEAR-Modifier  09/07/1977  . MAMMOGRAM  04/13/2019  . COLONOSCOPY  03/04/2027  . TETANUS/TDAP  01/08/2029  . INFLUENZA VACCINE  Completed  . Hepatitis C Screening  Completed  . HIV Screening  Completed     Discussed health benefits of physical activity, and encouraged her to engage in regular exercise appropriate for her age and condition.    Reviewed labs form last month --------------------------------------------------------------------  Problem List Items Addressed This Visit    None    Visit Diagnoses    Encounter for annual physical exam    -  Primary   Screening for cervical cancer       Relevant Orders   Cytology - PAP   Encounter for screening mammogram for malignant neoplasm  of breast       Relevant Orders   MM 3D SCREEN BREAST BILATERAL   Need for influenza vaccination       Relevant Orders   Flu Vaccine QUAD 6+ mos PF IM (Fluarix Quad PF) (Completed)   Need for diphtheria-tetanus-pertussis (Tdap) vaccine       Relevant Orders   Td : Tetanus/diphtheria >7yo Preservative  free (Completed)       Return in about 6 months (around 07/10/2019) for chronic disease f/u.   The entirety of the information documented in the History of Present Illness, Review of Systems and Physical Exam were personally obtained by me. Portions of this information were initially documented by Presley Raddle, CMA and reviewed by me for thoroughness and accuracy.    Keishawna Carranza, Marzella Schlein, MD MPH [facility]

## 2019-01-09 NOTE — Patient Instructions (Signed)

## 2019-01-10 ENCOUNTER — Telehealth: Payer: Self-pay | Admitting: Family Medicine

## 2019-01-10 NOTE — Telephone Encounter (Signed)
Added to [facility].

## 2019-01-10 NOTE — Telephone Encounter (Signed)
Pt called in to tell us her mothers maiden name Warrick Parisian.  She said it was ask on her questionnaire and she didn't know it at the time.  CB#  [phone]  Con Memos

## 2019-01-16 LAB — CYTOLOGY - PAP
Chlamydia: NEGATIVE
Diagnosis: NEGATIVE
High risk HPV: NEGATIVE
Neisseria Gonorrhea: NEGATIVE
Trichomonas: NEGATIVE

## 2019-02-20 ENCOUNTER — Other Ambulatory Visit: Payer: Self-pay | Admitting: Family Medicine

## 2019-02-20 DIAGNOSIS — I10 Essential (primary) hypertension: Secondary | ICD-10-CM

## 2019-03-13 ENCOUNTER — Encounter: Payer: Self-pay | Admitting: Family Medicine

## 2019-04-27 ENCOUNTER — Ambulatory Visit: Payer: BC Managed Care – PPO | Attending: Internal Medicine

## 2019-04-27 ENCOUNTER — Other Ambulatory Visit: Payer: BC Managed Care – PPO

## 2019-04-27 DIAGNOSIS — Z20822 Contact with and (suspected) exposure to covid-19: Secondary | ICD-10-CM | POA: Insufficient documentation

## 2019-04-28 LAB — NOVEL CORONAVIRUS, NAA: SARS-CoV-2, NAA: NOT DETECTED

## 2019-06-07 ENCOUNTER — Ambulatory Visit: Payer: BC Managed Care – PPO | Attending: Internal Medicine

## 2019-06-07 DIAGNOSIS — Z20822 Contact with and (suspected) exposure to covid-19: Secondary | ICD-10-CM

## 2019-06-08 ENCOUNTER — Other Ambulatory Visit: Payer: Self-pay | Admitting: Family Medicine

## 2019-06-08 DIAGNOSIS — N6320 Unspecified lump in the left breast, unspecified quadrant: Secondary | ICD-10-CM

## 2019-06-08 DIAGNOSIS — N6310 Unspecified lump in the right breast, unspecified quadrant: Secondary | ICD-10-CM

## 2019-06-08 DIAGNOSIS — R928 Other abnormal and inconclusive findings on diagnostic imaging of breast: Secondary | ICD-10-CM

## 2019-06-08 LAB — NOVEL CORONAVIRUS, NAA: SARS-CoV-2, NAA: NOT DETECTED

## 2019-06-26 ENCOUNTER — Telehealth: Payer: Self-pay

## 2019-06-26 ENCOUNTER — Ambulatory Visit
Admission: RE | Admit: 2019-06-26 | Discharge: 2019-06-26 | Disposition: A | Discharge status: Home / Self Care | Payer: BC Managed Care – PPO | Source: Ambulatory Visit | Attending: Family Medicine | Admitting: Family Medicine

## 2019-06-26 DIAGNOSIS — Z1231 Encounter for screening mammogram for malignant neoplasm of breast: Secondary | ICD-10-CM | POA: Diagnosis not present

## 2019-06-26 NOTE — Telephone Encounter (Signed)
-----   Message from Erasmo Downer, MD sent at 06/26/2019  2:09 PM EDT ----- Normal mammogram. Repeat in 1 yr

## 2019-06-26 NOTE — Telephone Encounter (Signed)
Comment seen by patient Monica Hoffman on 06/26/2019 2:23 PM EDT

## 2019-06-30 ENCOUNTER — Ambulatory Visit: Payer: BC Managed Care – PPO | Attending: Internal Medicine

## 2019-06-30 DIAGNOSIS — Z23 Encounter for immunization: Secondary | ICD-10-CM

## 2019-06-30 NOTE — Progress Notes (Signed)
   Covid-19 Vaccination Clinic  Name:  Monica Hoffman    MRN: [id number] DOB: [birth year]  06/30/2019  [name] was observed post Covid-19 immunization for 15 minutes without incident. She was provided with Vaccine Information Sheet and instruction to access the V-Safe system.   [name] was instructed to call 911 with any severe reactions post vaccine: Marland Kitchen Difficulty breathing  . Swelling of face and throat  . A fast heartbeat  . A bad rash all over body  . Dizziness and weakness   Immunizations Administered    Name Date Dose VIS Date Route   Pfizer COVID-19 Vaccine 06/30/2019  8:24 AM 0.3 mL 03/17/2019 Intramuscular   Manufacturer: [employer], [employer]   Lot: [id number]   NDC: [address]-6

## 2019-07-10 ENCOUNTER — Other Ambulatory Visit: Payer: Self-pay

## 2019-07-10 ENCOUNTER — Ambulatory Visit (INDEPENDENT_AMBULATORY_CARE_PROVIDER_SITE_OTHER): Payer: BC Managed Care – PPO | Admitting: Family Medicine

## 2019-07-10 ENCOUNTER — Encounter: Payer: Self-pay | Admitting: Family Medicine

## 2019-07-10 VITALS — BP 130/80 | HR 86 | Temp 96.9°F | Resp 18 | Ht 69.0 in | Wt 228.0 lb

## 2019-07-10 DIAGNOSIS — F419 Anxiety disorder, unspecified: Secondary | ICD-10-CM | POA: Diagnosis not present

## 2019-07-10 DIAGNOSIS — E782 Mixed hyperlipidemia: Secondary | ICD-10-CM

## 2019-07-10 DIAGNOSIS — R739 Hyperglycemia, unspecified: Secondary | ICD-10-CM | POA: Diagnosis not present

## 2019-07-10 DIAGNOSIS — Z6833 Body mass index (BMI) 33.0-33.9, adult: Secondary | ICD-10-CM

## 2019-07-10 DIAGNOSIS — I10 Essential (primary) hypertension: Secondary | ICD-10-CM

## 2019-07-10 DIAGNOSIS — R7303 Prediabetes: Secondary | ICD-10-CM

## 2019-07-10 DIAGNOSIS — M79621 Pain in right upper arm: Secondary | ICD-10-CM

## 2019-07-10 DIAGNOSIS — E669 Obesity, unspecified: Secondary | ICD-10-CM

## 2019-07-10 MED ORDER — CLONAZEPAM 0.5 MG PO TABS: 0.2500 mg | ORAL_TABLET | Freq: Two times a day (BID) | ORAL | 1 refills | Status: DC | PRN

## 2019-07-10 NOTE — Assessment & Plan Note (Signed)
Well controlled Continue current medications Recheck metabolic panel F/u in 6 months  

## 2019-07-10 NOTE — Assessment & Plan Note (Signed)
Low-carb diet Recheck A1c 

## 2019-07-10 NOTE — Progress Notes (Signed)
Patient: Monica Hoffman Female    DOB: Mar 31, 1956   64 y.o.   MRN: [id number] Visit Date: 07/10/2019  Today's Provider: Shirlee Latch, MD   Chief Complaint  Patient presents with  . Follow-up   Subjective:     HPI   Patient is here for chronic disease follow up.  HTN: - Medications: Varapimil - Compliance: good - Checking BP at home: no - Denies any SOB, CP, vision changes, LE edema, medication SEs, or symptoms of hypotension - Diet: low sodium - Exercise: non currently   HLD - medications: none - compliance: n/a - medication SEs: n/a   Bilateral arms: Both arms aching intermittently for a few years.  Previously had R breast cysts that she can palpate. R>L  Notices some puffiness in that arm occasionally. Normal mammogram, but worried about lymphedema (works as lymphedema therapist)  No Known Allergies   Current Outpatient Medications:  .  aspirin 81 MG tablet, , Disp: , Rfl:  .  clonazePAM (KLONOPIN) 0.5 MG tablet, Take 0.5-1 tablets (0.25-0.5 mg total) by mouth 2 (two) times daily as needed. for anxiety, Disp: 30 tablet, Rfl: 1 .  Multiple Vitamin (MULTIVITAMIN) tablet, Take 1 tablet by mouth daily., Disp: , Rfl:  .  verapamil (CALAN-SR) 120 MG CR tablet, TAKE ONE TABLET BY MOUTH AT BEDTIME WITH240MG  TABLET FOR TOTAL DOSE OF 360 DAILY, Disp: 90 tablet, Rfl: 0 .  verapamil (CALAN-SR) 240 MG CR tablet, TAKE 1 TABLET AT BEDTIME, Disp: 90 tablet, Rfl: 0  Review of Systems  Constitutional: Negative for appetite change, chills, fatigue and fever.  Respiratory: Negative for chest tightness and shortness of breath.   Cardiovascular: Negative for chest pain and palpitations.  Gastrointestinal: Negative for abdominal pain, nausea and vomiting.  Neurological: Negative for dizziness and weakness.    Social History   Tobacco Use  . Smoking status: Never Smoker  . Smokeless tobacco: Never Used  Substance Use Topics  . Alcohol use: Yes    Alcohol/week: 0.0 standard  drinks    Comment: on occasion      Objective:   BP 130/80   Pulse 86   Temp (!) 96.9 F (36.1 C) (Other (Comment))   Resp 18   Ht 5\' 9"  (1.753 m)   Wt 228 lb (103.4 kg)   SpO2 97%   BMI 33.67 kg/m  Vitals:   07/10/19 1102 07/10/19 1140  BP: (!) 130/91 130/80  Pulse: 86   Resp: 18   Temp: (!) 96.9 F (36.1 C)   TempSrc: Other (Comment)   SpO2: 97%   Weight: 228 lb (103.4 kg)   Height: 5\' 9"  (1.753 m)   Body mass index is 33.67 kg/m.   Physical Exam Vitals reviewed.  Constitutional:      General: She is not in acute distress.    Appearance: Normal appearance. She is well-developed. She is not diaphoretic.  HENT:     Head: Normocephalic and atraumatic.  Eyes:     General: No scleral icterus.    Conjunctiva/sclera: Conjunctivae normal.  Neck:     Thyroid: No thyromegaly.  Cardiovascular:     Rate and Rhythm: Normal rate and regular rhythm.     Pulses: Normal pulses.     Heart sounds: Normal heart sounds. No murmur.  Pulmonary:     Effort: Pulmonary effort is normal. No respiratory distress.     Breath sounds: Normal breath sounds. No wheezing, rhonchi or rales.  Musculoskeletal:     Cervical back:  Neck supple.     Right lower leg: No edema.     Left lower leg: No edema.     Comments: TTP in R axilla with possible deep LAD palpable  Lymphadenopathy:     Cervical: No cervical adenopathy.  Skin:    General: Skin is warm and dry.     Capillary Refill: Capillary refill takes less than 2 seconds.     Findings: No rash.  Neurological:     Mental Status: She is alert and oriented to person, place, and time. Mental status is at baseline.  Psychiatric:        Mood and Affect: Mood normal.        Behavior: Behavior normal.      No results found for any visits on 07/10/19.     Assessment & Plan    Problem List Items Addressed This Visit      Cardiovascular and Mediastinum   Essential (primary) hypertension - Primary    Well controlled Continue current  medications Recheck metabolic panel F/u in 6 months       Relevant Orders   Comprehensive metabolic panel     Other   Anxiety    Chronic and well controlled Using clonazepam very sparingly Difficult with spouse with dementia and caregiver stress Reviewed PMDP - last fill in 5/20 Refill given today      Relevant Medications   clonazePAM (KLONOPIN) 0.5 MG tablet   HLD (hyperlipidemia)    Not currently on a statin Recheck FLP and CMP Calculate ASCVD risk to determine need for primary prevention      Relevant Orders   Comprehensive metabolic panel   Lipid panel   Prediabetes    Low carb diet Recheck A1c      Obesity    Discussed importance of healthy weight management Discussed diet and exercise        Other Visit Diagnoses    Axillary tenderness, right       Relevant Orders   [place] AXILLA RIGHT       Return in about 6 months (around 01/09/2020) for CPE.   The entirety of the information documented in the History of Present Illness, Review of Systems and Physical Exam were personally obtained by me. Portions of this information were initially documented by April Ival Bible, CMA and reviewed by me for thoroughness and accuracy.    Juquan Reznick, Dionne Bucy, MD MPH [facility]

## 2019-07-10 NOTE — Assessment & Plan Note (Signed)
Discussed importance of healthy weight management Discussed diet and exercise  

## 2019-07-10 NOTE — Assessment & Plan Note (Signed)
Not currently on a statin Recheck FLP and CMP Calculate ASCVD risk to determine need for primary prevention 

## 2019-07-10 NOTE — Assessment & Plan Note (Signed)
Chronic and well controlled Using clonazepam very sparingly Difficult with spouse with dementia and caregiver stress Reviewed PMDP - last fill in 5/20 Refill given today

## 2019-07-13 DIAGNOSIS — I10 Essential (primary) hypertension: Secondary | ICD-10-CM | POA: Diagnosis not present

## 2019-07-13 DIAGNOSIS — E782 Mixed hyperlipidemia: Secondary | ICD-10-CM | POA: Diagnosis not present

## 2019-07-13 DIAGNOSIS — R739 Hyperglycemia, unspecified: Secondary | ICD-10-CM | POA: Diagnosis not present

## 2019-07-14 LAB — COMPREHENSIVE METABOLIC PANEL
ALT: 14 IU/L (ref 0–32)
AST: 14 IU/L (ref 0–40)
Albumin/Globulin Ratio: 1.9 (ref 1.2–2.2)
Albumin: 4.4 g/dL (ref 3.8–4.8)
Alkaline Phosphatase: 87 IU/L (ref 39–117)
BUN/Creatinine Ratio: 15 (ref 12–28)
BUN: 13 mg/dL (ref 8–27)
Bilirubin Total: 0.3 mg/dL (ref 0.0–1.2)
CO2: 21 mmol/L (ref 20–29)
Calcium: 9.5 mg/dL (ref 8.7–10.3)
Chloride: 105 mmol/L (ref 96–106)
Creatinine, Ser: 0.89 mg/dL (ref 0.57–1.00)
GFR calc Af Amer: 80 mL/min/{1.73_m2} (ref >59)
GFR calc non Af Amer: 70 mL/min/{1.73_m2} (ref >59)
Globulin, Total: 2.3 g/dL (ref 1.5–4.5)
Glucose: 116 mg/dL — ABNORMAL HIGH (ref 65–99)
Potassium: 4.2 mmol/L (ref 3.5–5.2)
Sodium: 141 mmol/L (ref 134–144)
Total Protein: 6.7 g/dL (ref 6.0–8.5)

## 2019-07-14 LAB — LIPID PANEL
Chol/HDL Ratio: 5.5 ratio — ABNORMAL HIGH (ref 0.0–4.4)
Cholesterol, Total: 252 mg/dL — ABNORMAL HIGH (ref 100–199)
HDL: 46 mg/dL (ref >39)
LDL Chol Calc (NIH): 174 mg/dL — ABNORMAL HIGH (ref 0–99)
Triglycerides: 171 mg/dL — ABNORMAL HIGH (ref 0–149)
VLDL Cholesterol Cal: 32 mg/dL (ref 5–40)

## 2019-07-14 LAB — HEMOGLOBIN A1C
Est. average glucose Bld gHb Est-mCnc: 140 mg/dL
Hgb A1c MFr Bld: 6.5 % — ABNORMAL HIGH (ref 4.8–5.6)

## 2019-07-18 ENCOUNTER — Telehealth: Payer: Self-pay

## 2019-07-18 NOTE — Telephone Encounter (Signed)
Pt advised.  Apt made for 10/23/2019 at 8:20.  Thanks,   -Vernona Rieger

## 2019-07-18 NOTE — Telephone Encounter (Signed)
-----   Message from Erasmo Downer, MD sent at 07/18/2019  2:10 PM EDT ----- Normal kidney function, electrolytes, liver function.  Cholesterol has gone up significantly.  A1c has also elevated and is now in the diabetic range.  Recommend changes to diet and exercise, including low-carb diet, diet low in saturated fat, and 30 minutes of exercise release 5 times weekly.  Need to follow-up in 3 months and repeat labs.  If these remain elevated, we will need to consider medications for these things.

## 2019-07-25 ENCOUNTER — Ambulatory Visit: Payer: BC Managed Care – PPO | Attending: Internal Medicine

## 2019-07-25 DIAGNOSIS — Z23 Encounter for immunization: Secondary | ICD-10-CM

## 2019-07-25 NOTE — Progress Notes (Signed)
   Covid-19 Vaccination Clinic  Name:  FARDOWSA AUTHIER    MRN: [id number] DOB: [birth year]  07/25/2019  Ms. Kievit was observed post Covid-19 immunization for 15 minutes without incident. She was provided with Vaccine Information Sheet and instruction to access the V-Safe system.   Ms. Heymann was instructed to call 911 with any severe reactions post vaccine: Marland Kitchen Difficulty breathing  . Swelling of face and throat  . A fast heartbeat  . A bad rash all over body  . Dizziness and weakness   Immunizations Administered    Name Date Dose VIS Date Route   Pfizer COVID-19 Vaccine 07/25/2019  8:16 AM 0.3 mL 05/31/2018 Intramuscular   Manufacturer: [employer], [employer]   Lot: [id number]   NDC: [address]-3

## 2019-08-02 ENCOUNTER — Ambulatory Visit: Payer: BC Managed Care – PPO

## 2019-08-10 ENCOUNTER — Other Ambulatory Visit: Payer: Self-pay | Admitting: Family Medicine

## 2019-08-10 DIAGNOSIS — I10 Essential (primary) hypertension: Secondary | ICD-10-CM

## 2019-08-11 ENCOUNTER — Other Ambulatory Visit: Payer: Self-pay | Admitting: Family Medicine

## 2019-08-11 DIAGNOSIS — I10 Essential (primary) hypertension: Secondary | ICD-10-CM

## 2019-08-11 MED ORDER — VERAPAMIL HCL ER 240 MG PO TBCR: 240.0000 mg | EXTENDED_RELEASE_TABLET | Freq: Every day | ORAL | 0 refills | Status: DC

## 2019-08-11 NOTE — Telephone Encounter (Signed)
Medication: verapamil (CALAN-SR) 240 MG CR tablet [[id number]]   Has the patient contacted their pharmacy? Yes  (Agent: If no, request that the patient contact the pharmacy for the refill.) (Agent: If yes, when and what did the pharmacy advise?)  Preferred Pharmacy (with phone number or street name): [facility] - [place], [place] - [address]  Phone:  [phone] Fax:  [phone]     Agent: Please be advised that RX refills may take up to 3 business days. We ask that you follow-up with your pharmacy.

## 2019-08-24 ENCOUNTER — Other Ambulatory Visit: Payer: BC Managed Care – PPO

## 2019-08-25 ENCOUNTER — Ambulatory Visit
Admission: RE | Admit: 2019-08-25 | Discharge: 2019-08-25 | Disposition: A | Discharge status: Home / Self Care | Payer: BC Managed Care – PPO | Source: Ambulatory Visit | Attending: Family Medicine | Admitting: Family Medicine

## 2019-08-25 ENCOUNTER — Other Ambulatory Visit: Payer: Self-pay

## 2019-08-25 DIAGNOSIS — N6489 Other specified disorders of breast: Secondary | ICD-10-CM | POA: Diagnosis not present

## 2019-08-25 DIAGNOSIS — M79621 Pain in right upper arm: Secondary | ICD-10-CM | POA: Diagnosis not present

## 2019-08-31 ENCOUNTER — Telehealth: Payer: Self-pay

## 2019-08-31 NOTE — Telephone Encounter (Signed)
-----   Message from Erasmo Downer, MD sent at 08/31/2019  1:58 PM EDT ----- Normal ultrasound with no enlarged lymph nodes.

## 2019-08-31 NOTE — Telephone Encounter (Signed)
Seen by patient Monica Hoffman on 08/31/2019 3:13 PM EDT

## 2019-10-23 ENCOUNTER — Ambulatory Visit (INDEPENDENT_AMBULATORY_CARE_PROVIDER_SITE_OTHER): Payer: BC Managed Care – PPO | Admitting: Physician Assistant

## 2019-10-23 ENCOUNTER — Other Ambulatory Visit: Payer: Self-pay

## 2019-10-23 ENCOUNTER — Encounter: Payer: Self-pay | Admitting: Physician Assistant

## 2019-10-23 ENCOUNTER — Ambulatory Visit: Payer: BC Managed Care – PPO | Admitting: Family Medicine

## 2019-10-23 VITALS — BP 134/93 | HR 86 | Temp 97.2°F | Resp 16 | Wt 228.8 lb

## 2019-10-23 DIAGNOSIS — R7303 Prediabetes: Secondary | ICD-10-CM | POA: Diagnosis not present

## 2019-10-23 DIAGNOSIS — E119 Type 2 diabetes mellitus without complications: Secondary | ICD-10-CM

## 2019-10-23 DIAGNOSIS — E669 Obesity, unspecified: Secondary | ICD-10-CM | POA: Diagnosis not present

## 2019-10-23 DIAGNOSIS — B0229 Other postherpetic nervous system involvement: Secondary | ICD-10-CM

## 2019-10-23 DIAGNOSIS — Z6833 Body mass index (BMI) 33.0-33.9, adult: Secondary | ICD-10-CM

## 2019-10-23 MED ORDER — PREDNISONE 10 MG (21) PO TBPK: ORAL_TABLET | ORAL | 0 refills | Status: DC

## 2019-10-23 NOTE — Progress Notes (Signed)
Established patient visit   Patient: Monica Hoffman   DOB: 1955/10/04   64 y.o. Female  MRN: [id number] Visit Date: 10/23/2019  Today's healthcare provider: Margaretann Loveless, PA-C   Chief Complaint  Patient presents with  . Follow-up    HTN and DM   Subjective    HPI Diabetes Mellitus Type II, Follow-up  Lab Results  Component Value Date   HGBA1C 6.6 (H) 10/23/2019   HGBA1C 6.5 (H) 07/13/2019   HGBA1C 6.2 (H) 12/14/2018   Wt Readings from Last 3 Encounters:  10/23/19 228 lb 12.8 oz (103.8 kg)  07/10/19 228 lb (103.4 kg)  01/09/19 224 lb (101.6 kg)   Last seen for diabetes 3 months ago.  Management since then includes Recommend changes to diet and exercise, including low-carb diet, diet low in saturated fat, and 30 minutes of exercise release 5 times weekly.   Symptoms: No fatigue No foot ulcerations  No appetite changes No nausea  No paresthesia of the feet  No polydipsia  No polyuria No visual disturbances   No vomiting     Home blood sugar records: not on medication and not being checked   Current insulin regiment: none Most Recent Eye Exam: not UTD Current exercise: walking 15 minutes everyday. Current diet habits: well balanced  Pertinent Labs: Lab Results  Component Value Date   CHOL 252 (H) 07/13/2019   HDL 46 07/13/2019   LDLCALC 174 (H) 07/13/2019   TRIG 171 (H) 07/13/2019   CHOLHDL 5.5 (H) 07/13/2019   Lab Results  Component Value Date   NA 141 07/13/2019   K 4.2 07/13/2019   CREATININE 0.89 07/13/2019   GFRNONAA 70 07/13/2019   GFRAA 80 07/13/2019   GLUCOSE 116 (H) 07/13/2019     --------------------------------------------------------------------------------------------------- Hypertension, follow-up  BP Readings from Last 3 Encounters:  10/23/19 (!) 134/93  07/10/19 130/80  01/09/19 121/87   Wt Readings from Last 3 Encounters:  10/23/19 228 lb 12.8 oz (103.8 kg)  07/10/19 228 lb (103.4 kg)  01/09/19 224 lb (101.6 kg)       She was last seen for hypertension 3 months ago.  BP at that visit was 130/80. Management since that visit includes continue current medication plan.  She reports excellent compliance with treatment. She is not having side effects.   She does not smoke.   Outside blood pressures are not being checked. Symptoms: No chest pain No chest pressure  No palpitations No syncope  No dyspnea No orthopnea  No paroxysmal nocturnal dyspnea No lower extremity edema   Pertinent labs: Lab Results  Component Value Date   CHOL 252 (H) 07/13/2019   HDL 46 07/13/2019   LDLCALC 174 (H) 07/13/2019   TRIG 171 (H) 07/13/2019   CHOLHDL 5.5 (H) 07/13/2019   Lab Results  Component Value Date   NA 141 07/13/2019   K 4.2 07/13/2019   CREATININE 0.89 07/13/2019   GFRNONAA 70 07/13/2019   GFRAA 80 07/13/2019   GLUCOSE 116 (H) 07/13/2019     The 10-year ASCVD risk score Denman George DC Jr., et al., 2013) is: 15.5%   ---------------------------------------------------------------------------------------------------  Patient Active Problem List   Diagnosis Date Noted  . Obesity 08/05/2018  . Anxiety 07/08/2015  . Prediabetes 07/08/2015  . Essential (primary) hypertension 11/02/2008  . HLD (hyperlipidemia) 11/16/2007  . Inflammatory disease of breast 11/30/2006   Past Medical History:  Diagnosis Date  . Anxiety   . Hyperglycemia   . Hyperlipidemia   . Hypertension  Medications: Outpatient Medications Prior to Visit  Medication Sig  . aspirin 81 MG tablet   . clonazePAM (KLONOPIN) 0.5 MG tablet Take 0.5-1 tablets (0.25-0.5 mg total) by mouth 2 (two) times daily as needed. for anxiety  . verapamil (CALAN-SR) 120 MG CR tablet TAKE ONE TABLET BY MOUTH AT BEDTIME WITH240MG  TABLET FOR TOTAL DOSE OF 360 DAILY  . verapamil (CALAN-SR) 240 MG CR tablet Take 1 tablet (240 mg total) by mouth at bedtime.  . [DISCONTINUED] Multiple Vitamin (MULTIVITAMIN) tablet Take 1 tablet by mouth daily.   No  facility-administered medications prior to visit.    Review of Systems  Last CBC Lab Results  Component Value Date   WBC 14.2 (H) 05/26/2007   HGB 11.8 DELTA CHECK NOTED (L) 05/26/2007   HCT 33.8 (L) 05/26/2007   MCV 87.0 05/26/2007   RDW 13.5 05/26/2007   PLT 186 05/26/2007   Last metabolic panel Lab Results  Component Value Date   GLUCOSE 116 (H) 07/13/2019   NA 141 07/13/2019   K 4.2 07/13/2019   CL 105 07/13/2019   CO2 21 07/13/2019   BUN 13 07/13/2019   CREATININE 0.89 07/13/2019   GFRNONAA 70 07/13/2019   GFRAA 80 07/13/2019   CALCIUM 9.5 07/13/2019   PROT 6.7 07/13/2019   ALBUMIN 4.4 07/13/2019   LABGLOB 2.3 07/13/2019   AGRATIO 1.9 07/13/2019   BILITOT 0.3 07/13/2019   ALKPHOS 87 07/13/2019   AST 14 07/13/2019   ALT 14 07/13/2019      Objective    BP (!) 134/93 (BP Location: Left Arm, Patient Position: Sitting, Cuff Size: Large)   Pulse 86   Temp (!) 97.2 F (36.2 C) (Temporal)   Resp 16   Wt 228 lb 12.8 oz (103.8 kg)   BMI 33.79 kg/m  BP Readings from Last 3 Encounters:  10/23/19 (!) 134/93  07/10/19 130/80  01/09/19 121/87   Wt Readings from Last 3 Encounters:  10/23/19 228 lb 12.8 oz (103.8 kg)  07/10/19 228 lb (103.4 kg)  01/09/19 224 lb (101.6 kg)      Physical Exam Vitals reviewed.  Constitutional:      General: She is not in acute distress.    Appearance: Normal appearance. She is well-developed. She is obese. She is not ill-appearing or diaphoretic.  Cardiovascular:     Rate and Rhythm: Normal rate and regular rhythm.     Pulses: Normal pulses.     Heart sounds: Normal heart sounds. No murmur heard.  No friction rub. No gallop.   Pulmonary:     Effort: Pulmonary effort is normal. No respiratory distress.     Breath sounds: Normal breath sounds. No wheezing or rales.  Musculoskeletal:     Cervical back: Normal range of motion and neck supple.     Right lower leg: No edema.     Left lower leg: No edema.  Neurological:      Mental Status: She is alert.       Results for orders placed or performed in visit on 10/23/19  T4 AND TSH  Result Value Ref Range   TSH 1.820 0.450 - 4.500 uIU/mL   T4, Total 7.4 4.5 - 12.0 ug/dL  HgB C3J  Result Value Ref Range   Hgb A1c MFr Bld 6.6 (H) 4.8 - 5.6 %   Est. average glucose Bld gHb Est-mCnc 143 mg/dL    Assessment & Plan     1. Type 2 diabetes mellitus without complication, without long-term current use of  insulin (HCC) Most recent a1c had been elevated. Has been working on healthy lifestyle modifications over last 3 months. Will check labs as below and f/u pending results. - T4 AND TSH - HgB A1c  2. Class 1 obesity with serious comorbidity and body mass index (BMI) of 33.0 to 33.9 in adult, unspecified obesity type Counseled patient on healthy lifestyle modifications including dieting and exercise.  - T4 AND TSH - HgB A1c  3. Postherpetic neuralgia Patient had shingles but never treated. Will try prednisone taper as below to see if that helps calm down symptoms. If not, may require gabapentin to lessen symptoms.  - predniSONE (STERAPRED UNI-PAK 21 TAB) 10 MG (21) TBPK tablet; 6 day taper; take as directed on package instructions  Dispense: 21 tablet; Refill: 0   Return in about 3 months (around 01/23/2020) for has appt on 01/30/20.      Delmer Islam, PA-C, have reviewed all documentation for this visit. The documentation on 10/25/19 for the exam, diagnosis, procedures, and orders are all accurate and complete.   Reine Just  [facility] [phone] (phone) [phone] (fax)  [facility]

## 2019-10-23 NOTE — Patient Instructions (Signed)

## 2019-10-24 LAB — HEMOGLOBIN A1C
Est. average glucose Bld gHb Est-mCnc: 143 mg/dL
Hgb A1c MFr Bld: 6.6 % — ABNORMAL HIGH (ref 4.8–5.6)

## 2019-10-24 LAB — T4 AND TSH
T4, Total: 7.4 ug/dL (ref 4.5–12.0)
TSH: 1.82 u[IU]/mL (ref 0.450–4.500)

## 2019-10-25 ENCOUNTER — Telehealth: Payer: Self-pay

## 2019-10-25 NOTE — Telephone Encounter (Signed)
Patient advised as below. Patient would like to work on diet and exercise before starting medication.

## 2019-10-25 NOTE — Telephone Encounter (Signed)
-----   Message from Margaretann Loveless, [place] sent at 10/24/2019 10:38 AM EDT ----- Thyroid is normal. A1c has continued to increase from 6.5 to now 6.6. Would recommend to start metformin for diabetes. If agreeable can send in metformin 500mg  once daily with breakfast #90 no refills. Has f/u with Dr. in October.

## 2019-10-25 NOTE — Telephone Encounter (Signed)
LMTCB 10/25/2019.  [facility] please advise pt as below when she calls back.   Thanks,   -Vernona Rieger

## 2019-12-08 ENCOUNTER — Other Ambulatory Visit: Payer: Self-pay | Admitting: Family Medicine

## 2019-12-08 DIAGNOSIS — I10 Essential (primary) hypertension: Secondary | ICD-10-CM

## 2019-12-08 DIAGNOSIS — F419 Anxiety disorder, unspecified: Secondary | ICD-10-CM

## 2019-12-08 NOTE — Telephone Encounter (Signed)
Requested medication (s) are due for refill today:yes   Requested medication (s) are on the active medication list: yes   Last refill:  10/30/2019  Future visit scheduled: yes   Notes to clinic:  this refill cannot be delegated    Requested Prescriptions  Pending Prescriptions Disp Refills   clonazePAM (KLONOPIN) 0.5 MG tablet [Pharmacy Med Name: CLONAZEPAM 0.5 MG TAB] 30 tablet     Sig: TAKE 1/2 TO 1 TABLET BY MOUTH TWICE DAILY AS NEEDED FOR ANXIETY      Not Delegated - Psychiatry:  Anxiolytics/Hypnotics Failed - 12/08/2019 10:54 AM      Failed - This refill cannot be delegated      Failed - Urine Drug Screen completed in last 360 days.      Passed - Valid encounter within last 6 months    Recent Outpatient Visits           1 month ago Type 2 diabetes mellitus without complication, without long-term current use of insulin [facility])   [facility], [place], [place]   5 months ago Essential (primary) hypertension   [facility], Marzella Schlein, [place]   11 months ago Encounter for annual physical exam   [facility], Marzella Schlein, [place]   11 months ago Cystitis with hematuria   [facility], Marzella Schlein, [place]   1 year ago Bilateral impacted cerumen   [facility], Alessandra Bevels, [place]       Future Appointments             In 1 month [place], Marzella Schlein, MD [facility], [facility]

## 2020-01-10 ENCOUNTER — Encounter: Payer: BC Managed Care – PPO | Admitting: Family Medicine

## 2020-01-18 ENCOUNTER — Other Ambulatory Visit: Payer: Self-pay | Admitting: Family Medicine

## 2020-01-18 DIAGNOSIS — I10 Essential (primary) hypertension: Secondary | ICD-10-CM

## 2020-01-19 ENCOUNTER — Other Ambulatory Visit: Payer: Self-pay | Admitting: Family Medicine

## 2020-01-19 ENCOUNTER — Encounter: Payer: BC Managed Care – PPO | Admitting: Family Medicine

## 2020-01-19 DIAGNOSIS — I10 Essential (primary) hypertension: Secondary | ICD-10-CM

## 2020-01-26 ENCOUNTER — Ambulatory Visit (INDEPENDENT_AMBULATORY_CARE_PROVIDER_SITE_OTHER): Payer: BC Managed Care – PPO | Admitting: Adult Health

## 2020-01-26 ENCOUNTER — Encounter: Payer: Self-pay | Admitting: Adult Health

## 2020-01-26 ENCOUNTER — Other Ambulatory Visit: Payer: Self-pay

## 2020-01-26 VITALS — BP 140/96 | HR 95 | Temp 98.3°F | Resp 16 | Wt 228.0 lb

## 2020-01-26 DIAGNOSIS — R319 Hematuria, unspecified: Secondary | ICD-10-CM

## 2020-01-26 DIAGNOSIS — I10 Essential (primary) hypertension: Secondary | ICD-10-CM | POA: Diagnosis not present

## 2020-01-26 DIAGNOSIS — F419 Anxiety disorder, unspecified: Secondary | ICD-10-CM | POA: Diagnosis not present

## 2020-01-26 DIAGNOSIS — N3090 Cystitis, unspecified without hematuria: Secondary | ICD-10-CM | POA: Diagnosis not present

## 2020-01-26 DIAGNOSIS — N390 Urinary tract infection, site not specified: Secondary | ICD-10-CM | POA: Diagnosis not present

## 2020-01-26 LAB — POCT URINALYSIS DIPSTICK
Bilirubin, UA: NEGATIVE
Glucose, UA: NEGATIVE
Ketones, UA: NEGATIVE
Nitrite, UA: POSITIVE
Protein, UA: POSITIVE — AB
Spec Grav, UA: 1.015 (ref 1.010–1.025)
Urobilinogen, UA: 0.2 E.U./dL
pH, UA: 6 (ref 5.0–8.0)

## 2020-01-26 MED ORDER — CEPHALEXIN 500 MG PO CAPS: 500.0000 mg | ORAL_CAPSULE | Freq: Two times a day (BID) | ORAL | 0 refills | Status: DC

## 2020-01-26 MED ORDER — VERAPAMIL HCL ER 120 MG PO TBCR: 120.0000 mg | EXTENDED_RELEASE_TABLET | Freq: Every day | ORAL | 0 refills | Status: DC

## 2020-01-26 MED ORDER — CLONAZEPAM 0.5 MG PO TABS: 0.2500 mg | ORAL_TABLET | Freq: Two times a day (BID) | ORAL | 0 refills | Status: DC | PRN

## 2020-01-26 NOTE — Patient Instructions (Signed)
Urinary Tract Infection, Adult °A urinary tract infection (UTI) is an infection of any part of the urinary tract. The urinary tract includes: °· The kidneys. °· The ureters. °· The bladder. °· The urethra. °These organs make, store, and get rid of pee (urine) in the body. °What are the causes? °This is caused by germs (bacteria) in your genital area. These germs grow and cause swelling (inflammation) of your urinary tract. °What increases the risk? °You are more likely to develop this condition if: °· You have a small, thin tube (catheter) to drain pee. °· You cannot control when you pee or poop (incontinence). °· You are female, and: °? You use these methods to prevent pregnancy: °§ A medicine that kills sperm (spermicide). °§ A device that blocks sperm (diaphragm). °? You have low levels of a female hormone (estrogen). °? You are pregnant. °· You have genes that add to your risk. °· You are sexually active. °· You take antibiotic medicines. °· You have trouble peeing because of: °? A prostate that is bigger than normal, if you are female. °? A blockage in the part of your body that drains pee from the bladder (urethra). °? A kidney stone. °? A nerve condition that affects your bladder (neurogenic bladder). °? Not getting enough to drink. °? Not peeing often enough. °· You have other conditions, such as: °? Diabetes. °? A weak disease-fighting system (immune system). °? Sickle cell disease. °? Gout. °? Injury of the spine. °What are the signs or symptoms? °Symptoms of this condition include: °· Needing to pee right away (urgently). °· Peeing often. °· Peeing small amounts often. °· Pain or burning when peeing. °· Blood in the pee. °· Pee that smells bad or not like normal. °· Trouble peeing. °· Pee that is cloudy. °· Fluid coming from the vagina, if you are female. °· Pain in the belly or lower back. °Other symptoms include: °· Throwing up (vomiting). °· No urge to eat. °· Feeling mixed up (confused). °· Being tired  and grouchy (irritable). °· A fever. °· Watery poop (diarrhea). °How is this treated? °This condition may be treated with: °· Antibiotic medicine. °· Other medicines. °· Drinking enough water. °Follow these instructions at home: ° °Medicines °· Take over-the-counter and prescription medicines only as told by your doctor. °· If you were prescribed an antibiotic medicine, take it as told by your doctor. Do not stop taking it even if you start to feel better. °General instructions °· Make sure you: °? Pee until your bladder is empty. °? Do not hold pee for a long time. °? Empty your bladder after sex. °? Wipe from front to back after pooping if you are a female. Use each tissue one time when you wipe. °· Drink enough fluid to keep your pee pale yellow. °· Keep all follow-up visits as told by your doctor. This is important. °Contact a doctor if: °· You do not get better after 1-2 days. °· Your symptoms go away and then come back. °Get help right away if: °· You have very bad back pain. °· You have very bad pain in your lower belly. °· You have a fever. °· You are sick to your stomach (nauseous). °· You are throwing up. °Summary °· A urinary tract infection (UTI) is an infection of any part of the urinary tract. °· This condition is caused by germs in your genital area. °· There are many risk factors for a UTI. These include having a small, thin   tube to drain pee and not being able to control when you pee or poop. °· Treatment includes antibiotic medicines for germs. °· Drink enough fluid to keep your pee pale yellow. °This information is not intended to replace advice given to you by your health care provider. Make sure you discuss any questions you have with your health care provider. °Document Revised: 03/10/2018 Document Reviewed: 09/30/2017 °Elsevier Patient Education © 2020 [employer] °Cephalexin Tablets or Capsules °What is this medicine? °CEPHALEXIN (sef a LEX in) is a cephalosporin antibiotic. It treats some  infections caused by bacteria. It will not work for colds, the flu, or other viruses. °This medicine may be used for other purposes; ask your health care provider or pharmacist if you have questions. °COMMON BRAND NAME(S): Biocef, Daxbia, Keflex, Keftab °What should I tell my health care provider before I take this medicine? °They need to know if you have any of these conditions: °· kidney disease °· stomach or intestine problems, especially colitis °· an unusual or allergic reaction to cephalexin, other cephalosporins, penicillins, other antibiotics, medicines, foods, dyes or preservatives °· pregnant or trying to get pregnant °· breast-feeding °How should I use this medicine? °Take this drug by mouth. Take it as directed on the prescription label at the same time every day. You can take it with or without food. If it upsets your stomach, take it with food. Take all of this drug unless your health care provider tells you to stop it early. Keep taking it even if you think you are better. °Talk to your health care provider about the use of this drug in children. While it may be prescribed for selected conditions, precautions do apply. °Overdosage: If you think you have taken too much of this medicine contact a poison control center or emergency room at once. °NOTE: This medicine is only for you. Do not share this medicine with others. °What if I miss a dose? °If you miss a dose, take it as soon as you can. If it is almost time for your next dose, take only that dose. Do not take double or extra doses. °What may interact with this medicine? °· probenecid °· some other antibiotics °This list may not describe all possible interactions. Give your health care provider a list of all the medicines, herbs, non-prescription drugs, or dietary supplements you use. Also tell them if you smoke, drink alcohol, or use illegal drugs. Some items may interact with your medicine. °What should I watch for while using this  medicine? °Tell your doctor or health care provider if your symptoms do not begin to improve in a few days. °This medicine may cause serious skin reactions. They can happen weeks to months after starting the medicine. Contact your health care provider right away if you notice fevers or flu-like symptoms with a rash. The rash may be red or purple and then turn into blisters or peeling of the skin. Or, you might notice a red rash with swelling of the face, lips or lymph nodes in your neck or under your arms. °Do not treat diarrhea with over the counter products. Contact your doctor if you have diarrhea that lasts more than 2 days or if it is severe and watery. °If you have diabetes, you may get a false-positive result for sugar in your urine. Check with your doctor or health care provider. °What side effects may I notice from receiving this medicine? °Side effects that you should report to your doctor or health care professional   as soon as possible: °· allergic reactions like skin rash, itching or hives, swelling of the face, lips, or tongue °· breathing problems °· pain or trouble passing urine °· redness, blistering, peeling or loosening of the skin, including inside the mouth °· severe or watery diarrhea °· unusually weak or tired °· yellowing of the eyes, skin °Side effects that usually do not require medical attention (report to your doctor or health care professional if they continue or are bothersome): °· gas or heartburn °· genital or anal irritation °· headache °· joint or muscle pain °· nausea, vomiting °This list may not describe all possible side effects. Call your doctor for medical advice about side effects. You may report side effects to FDA at 1-800-FDA-1088. °Where should I keep my medicine? °Keep out of the reach of children and pets. °Store at room temperature between 20 and 25 degrees C (68 and 77 degrees F). Throw away any unused drug after the expiration date. °NOTE: This sheet is a summary. It  may not cover all possible information. If you have questions about this medicine, talk to your doctor, pharmacist, or health care provider. °© 2020 Elsevier/Gold Standard (2018-10-28 11:27:00) ° °

## 2020-01-26 NOTE — Progress Notes (Signed)
Established patient visit   Patient: Monica Hoffman   DOB: 10/08/1955   64 y.o. Female  MRN: [id number] Visit Date: 01/26/2020  Today's healthcare provider: Jairo Ben, FNP   Chief Complaint  Patient presents with  . Urinary Frequency   Subjective    Urinary Frequency  This is a new problem. The current episode started in the past 7 days. The problem occurs every urination. The pain is mild. There has been no fever. Associated symptoms include chills, frequency, hesitancy, sweats and urgency. Pertinent negatives include no discharge, flank pain, hematuria, nausea, possible pregnancy or vomiting. Treatments tried: patient drank cranberry juice.    Symptoms onset 2 days. Afebrile. No back pain. Urinary frequency, hesitancy, denies pain or burning " yet " . Mild low back pain.  Denies hematuria.  Patient  denies any fever, body aches,chills, rash, chest pain, shortness of breath, nausea, vomiting, or diarrhea.       Medications: Outpatient Medications Prior to Visit  Medication Sig  . aspirin 81 MG tablet   . predniSONE (STERAPRED UNI-PAK 21 TAB) 10 MG (21) TBPK tablet 6 day taper; take as directed on package instructions  . verapamil (CALAN-SR) 240 MG CR tablet TAKE ONE TABLET BY MOUTH AT BEDTIME  . [DISCONTINUED] clonazePAM (KLONOPIN) 0.5 MG tablet TAKE 1/2 TO 1 TABLET BY MOUTH TWICE DAILY AS NEEDED FOR ANXIETY  . [DISCONTINUED] verapamil (CALAN-SR) 120 MG CR tablet TAKE ONE TABLET BY MOUTH AT BEDTIME WITH240MG  TABLET FOR TOTAL DOSE OF 360 DAILY   No facility-administered medications prior to visit.    Review of Systems  Constitutional: Positive for chills. Negative for activity change, appetite change, diaphoresis, fatigue, fever and unexpected weight change.  HENT: Negative.   Respiratory: Negative.   Cardiovascular: Negative.   Gastrointestinal: Negative.  Negative for nausea and vomiting.  Genitourinary: Positive for frequency, hesitancy and urgency.  Negative for decreased urine volume, difficulty urinating, dyspareunia, dysuria, enuresis, flank pain, genital sores, hematuria, menstrual problem, pelvic pain, vaginal bleeding, vaginal discharge and vaginal pain.  Musculoskeletal: Negative.       Objective    BP (!) 140/96   Pulse 95   Temp 98.3 F (36.8 C) (Oral)   Resp 16   Wt 228 lb (103.4 kg)   SpO2 97%   BMI 33.67 kg/m    Physical Exam Vitals reviewed.  Constitutional:      General: She is not in acute distress.    Appearance: Normal appearance. She is not ill-appearing, toxic-appearing or diaphoretic.  Eyes:     General: No scleral icterus.    Conjunctiva/sclera: Conjunctivae normal.     Pupils: Pupils are equal, round, and reactive to light.  Cardiovascular:     Rate and Rhythm: Normal rate and regular rhythm.     Pulses: Normal pulses.     Heart sounds: Normal heart sounds. No murmur heard.  No friction rub. No gallop.   Pulmonary:     Effort: Pulmonary effort is normal. No respiratory distress.     Breath sounds: Normal breath sounds. No stridor. No wheezing, rhonchi or rales.  Chest:     Chest wall: No tenderness.  Abdominal:     General: There is no distension.     Palpations: Abdomen is soft. There is no mass.     Tenderness: There is no abdominal tenderness. There is no right CVA tenderness, left CVA tenderness, guarding or rebound.     Hernia: No hernia is present.  Musculoskeletal:  General: Normal range of motion.     Cervical back: Normal range of motion and neck supple. No rigidity or tenderness.  Lymphadenopathy:     Cervical: No cervical adenopathy.  Skin:    General: Skin is warm.     Capillary Refill: Capillary refill takes less than 2 seconds.     Findings: No erythema.  Neurological:     General: No focal deficit present.     Mental Status: She is oriented to person, place, and time. Mental status is at baseline.     Motor: No weakness.     Gait: Gait normal.  Psychiatric:         Mood and Affect: Mood normal.        Behavior: Behavior normal.        Thought Content: Thought content normal.        Judgment: Judgment normal.      Results for orders placed or performed in visit on 01/26/20  CULTURE, URINE COMPREHENSIVE   Specimen: Urine   Urine  Result Value Ref Range   Urine Culture, Comprehensive Preliminary report (A)    Organism ID, Bacteria Escherichia coli (A)    Organism ID, Bacteria Comment    Organism ID, Bacteria Comment   POCT urinalysis dipstick  Result Value Ref Range   Color, UA dark yellow    Clarity, UA cloudy    Glucose, UA Negative Negative   Bilirubin, UA negative    Ketones, UA negative    Spec Grav, UA 1.015 1.010 - 1.025   Blood, UA large    pH, UA 6.0 5.0 - 8.0   Protein, UA Positive (A) Negative   Urobilinogen, UA 0.2 0.2 or 1.0 E.U./dL   Nitrite, UA positive    Leukocytes, UA Large (3+) (A) Negative   Appearance     Odor      Assessment & Plan    Urinary tract infection with hematuria, site unspecified - Plan: Urine Culture, POCT urinalysis dipstick, CULTURE, URINE COMPREHENSIVE  Anxiety - Plan: clonazePAM (KLONOPIN) 0.5 MG tablet  Essential (primary) hypertension - Plan: verapamil (CALAN-SR) 120 MG CR tablet  Meds ordered this encounter  Medications  . cephALEXin (KEFLEX) 500 MG capsule    Sig: Take 1 capsule (500 mg total) by mouth 2 (two) times daily for 5 days.    Dispense:  10 capsule    Refill:  0  . verapamil (CALAN-SR) 120 MG CR tablet    Sig: Take 1 tablet (120 mg total) by mouth daily.    Dispense:  90 tablet    Refill:  0    patient is out please fill or give loaners until ready  . clonazePAM (KLONOPIN) 0.5 MG tablet    Sig: Take 0.5-1 tablets (0.25-0.5 mg total) by mouth 2 (two) times daily as needed. for anxiety    Dispense:  30 tablet    Refill:  0  Patient has been off her Verapamil 120 mg daily dose for over three days, she needed refills.  Requests refill on Klonopin.    Start keflex as  above. Increase hydration.    Orders Placed This Encounter  Procedures  . Urine Culture  . CULTURE, URINE COMPREHENSIVE  . POCT urinalysis dipstick     Return if symptoms worsen or fail to improve, for at any time for any worsening symptoms, Go to Emergency room/ urgent care if worse.         Jairo Ben, FNP  [facility] [phone] (phone)  [phone] (fax)  [place]

## 2020-01-26 NOTE — Progress Notes (Signed)
Started Keflex 500mg  bid x 5 days, culture sent , will call if abx change, has CPE with PCP  next week. Follow up as needed per strict return precautions.

## 2020-01-28 DIAGNOSIS — B9620 Unspecified Escherichia coli [E. coli] as the cause of diseases classified elsewhere: Secondary | ICD-10-CM | POA: Insufficient documentation

## 2020-01-28 DIAGNOSIS — N390 Urinary tract infection, site not specified: Secondary | ICD-10-CM | POA: Insufficient documentation

## 2020-01-30 ENCOUNTER — Encounter: Payer: Self-pay | Admitting: Family Medicine

## 2020-01-30 ENCOUNTER — Ambulatory Visit (INDEPENDENT_AMBULATORY_CARE_PROVIDER_SITE_OTHER): Payer: BC Managed Care – PPO | Admitting: Family Medicine

## 2020-01-30 ENCOUNTER — Other Ambulatory Visit: Payer: Self-pay

## 2020-01-30 VITALS — BP 128/89 | HR 85 | Temp 99.1°F | Ht 69.0 in | Wt 229.0 lb

## 2020-01-30 DIAGNOSIS — E1169 Type 2 diabetes mellitus with other specified complication: Secondary | ICD-10-CM | POA: Diagnosis not present

## 2020-01-30 DIAGNOSIS — E1159 Type 2 diabetes mellitus with other circulatory complications: Secondary | ICD-10-CM

## 2020-01-30 DIAGNOSIS — E669 Obesity, unspecified: Secondary | ICD-10-CM

## 2020-01-30 DIAGNOSIS — Z0000 Encounter for general adult medical examination without abnormal findings: Secondary | ICD-10-CM

## 2020-01-30 DIAGNOSIS — F419 Anxiety disorder, unspecified: Secondary | ICD-10-CM | POA: Diagnosis not present

## 2020-01-30 DIAGNOSIS — Z6833 Body mass index (BMI) 33.0-33.9, adult: Secondary | ICD-10-CM

## 2020-01-30 DIAGNOSIS — E785 Hyperlipidemia, unspecified: Secondary | ICD-10-CM

## 2020-01-30 DIAGNOSIS — I152 Hypertension secondary to endocrine disorders: Secondary | ICD-10-CM

## 2020-01-30 LAB — CULTURE, URINE COMPREHENSIVE

## 2020-01-30 MED ORDER — CLONAZEPAM 0.5 MG PO TABS: 0.2500 mg | ORAL_TABLET | Freq: Two times a day (BID) | ORAL | 5 refills | Status: DC | PRN

## 2020-01-30 MED ORDER — VERAPAMIL HCL ER 240 MG PO TBCR
240.0000 mg | EXTENDED_RELEASE_TABLET | Freq: Every day | ORAL | 1 refills | Status: DC
Start: 2020-01-30 — End: 2020-07-18

## 2020-01-30 MED ORDER — VERAPAMIL HCL ER 120 MG PO TBCR
120.0000 mg | EXTENDED_RELEASE_TABLET | Freq: Every day | ORAL | 1 refills | Status: DC
Start: 2020-01-30 — End: 2020-09-03

## 2020-01-30 NOTE — Assessment & Plan Note (Signed)
Discussed importance of healthy weight management Discussed diet and exercise  

## 2020-01-30 NOTE — Progress Notes (Signed)
Annual Wellness Visit     Patient: Monica Hoffman, Female    DOB: [birth year], 64 y.o.   MRN: [id number] Visit Date: 01/30/2020  Today's Provider: Shirlee Latch, MD   Chief Complaint  Patient presents with  . Annual Exam   Subjective    Monica Hoffman is a 64 y.o. female who presents today for her Annual Wellness Visit. She reports consuming a general diet.  She generally feels well. She reports sleeping well. She does not have additional problems to discuss today.   HPI   Patient Active Problem List   Diagnosis Date Noted  . Obesity 08/05/2018  . Anxiety 07/08/2015  . T2DM (type 2 diabetes mellitus) (HCC) 07/08/2015  . Hypertension associated with diabetes (HCC) 11/02/2008  . Hyperlipidemia associated with type 2 diabetes mellitus (HCC) 11/16/2007  . Inflammatory disease of breast 11/30/2006   Past Medical History:  Diagnosis Date  . Anxiety   . Hyperglycemia   . Hyperlipidemia   . Hypertension    Social History   Tobacco Use  . Smoking status: Never Smoker  . Smokeless tobacco: Never Used  Vaping Use  . Vaping Use: Never used  Substance Use Topics  . Alcohol use: Yes    Alcohol/week: 0.0 standard drinks    Comment: on occasion  . Drug use: Never   No Known Allergies   Medications: Outpatient Medications Prior to Visit  Medication Sig  . aspirin 81 MG tablet   . [DISCONTINUED] cephALEXin (KEFLEX) 500 MG capsule Take 1 capsule (500 mg total) by mouth 2 (two) times daily for 5 days.  . [DISCONTINUED] clonazePAM (KLONOPIN) 0.5 MG tablet Take 0.5-1 tablets (0.25-0.5 mg total) by mouth 2 (two) times daily as needed. for anxiety  . [DISCONTINUED] verapamil (CALAN-SR) 120 MG CR tablet Take 1 tablet (120 mg total) by mouth daily.  . [DISCONTINUED] verapamil (CALAN-SR) 240 MG CR tablet TAKE ONE TABLET BY MOUTH AT BEDTIME  . [DISCONTINUED] predniSONE (STERAPRED UNI-PAK 21 TAB) 10 MG (21) TBPK tablet 6 day taper; take as directed on package instructions    No facility-administered medications prior to visit.    No Known Allergies  Patient Care Team: Erasmo Downer, MD as PCP - General (Family Medicine)  Review of Systems  Constitutional: Negative.   HENT: Negative.   Eyes: Negative.   Respiratory: Negative.   Cardiovascular: Negative.   Gastrointestinal: Negative.   Endocrine: Negative.   Genitourinary: Negative.   Musculoskeletal: Positive for arthralgias and myalgias. Negative for back pain, gait problem, joint swelling, neck pain and neck stiffness.  Skin: Negative.   Allergic/Immunologic: Negative.   Neurological: Negative.   Hematological: Negative.   Psychiatric/Behavioral: Negative.       Objective    Vitals: BP 128/89 (BP Location: Right Arm, Patient Position: Sitting, Cuff Size: Large)   Pulse 85   Temp 99.1 F (37.3 C) (Oral)   Ht 5\' 9"  (1.753 m)   Wt 229 lb (103.9 kg)   BMI 33.82 kg/m    Physical Exam Vitals reviewed.  Constitutional:      General: She is not in acute distress.    Appearance: Normal appearance. She is well-developed. She is not diaphoretic.  HENT:     Head: Normocephalic and atraumatic.     Right Ear: Tympanic membrane, ear canal and external ear normal.     Left Ear: Tympanic membrane, ear canal and external ear normal.  Eyes:     General: No scleral icterus.    Conjunctiva/sclera:  Conjunctivae normal.     Pupils: Pupils are equal, round, and reactive to light.  Neck:     Thyroid: No thyromegaly.  Cardiovascular:     Rate and Rhythm: Normal rate and regular rhythm.     Pulses: Normal pulses.     Heart sounds: Normal heart sounds. No murmur heard.   Pulmonary:     Effort: Pulmonary effort is normal. No respiratory distress.     Breath sounds: Normal breath sounds. No wheezing or rales.  Abdominal:     General: There is no distension.     Palpations: Abdomen is soft.     Tenderness: There is no abdominal tenderness.  Musculoskeletal:        General: No deformity.      Cervical back: Neck supple.     Right lower leg: No edema.     Left lower leg: No edema.  Lymphadenopathy:     Cervical: No cervical adenopathy.  Skin:    General: Skin is warm and dry.     Findings: No rash.  Neurological:     Mental Status: She is alert and oriented to person, place, and time. Mental status is at baseline.     Sensory: No sensory deficit.     Motor: No weakness.     Gait: Gait normal.  Psychiatric:        Mood and Affect: Mood normal.        Behavior: Behavior normal.        Thought Content: Thought content normal.      Most recent functional status assessment: In your present state of health, do you have any difficulty performing the following activities: 01/30/2020  Hearing? N  Vision? N  Difficulty concentrating or making decisions? N  Walking or climbing stairs? N  Dressing or bathing? N  Doing errands, shopping? N  Some recent data might be hidden   Most recent fall risk assessment: Fall Risk  01/30/2020  Falls in the past year? 0  Number falls in past yr: 0  Injury with Fall? 0  Risk for fall due to : No Fall Risks  Follow up Falls evaluation completed    Most recent depression screenings: PHQ 2/9 Scores 01/30/2020 01/09/2019  PHQ - 2 Score 0 0  PHQ- 9 Score 0 1   Most recent cognitive screening: No flowsheet data found. Most recent Audit-C alcohol use screening Alcohol Use Disorder Test (AUDIT) 01/30/2020  1. How often do you have a drink containing alcohol? 1  2. How many drinks containing alcohol do you have on a typical day when you are drinking? 0  3. How often do you have six or more drinks on one occasion? 0  AUDIT-C Score 1  Alcohol Brief Interventions/Follow-up AUDIT Score <7 follow-up not indicated   A score of 3 or more in women, and 4 or more in men indicates increased risk for alcohol abuse, EXCEPT if all of the points are from question 1   No results found for any visits on 01/30/20.  Assessment & Plan     Annual  wellness visit done today including the all of the following: Reviewed patient's Family Medical History Reviewed and updated list of patient's medical providers Assessment of cognitive impairment was done Assessed patient's functional ability Established a written schedule for health screening services Health Risk Assessent Completed and Reviewed  Exercise Activities and Dietary recommendations Goals   None     Immunization History  Administered Date(s) Administered  . Influenza Split  03/13/2011, 12/18/2011  . Influenza,inj,Quad PF,6+ Mos 01/30/2013, 04/23/2014, 01/06/2016, 02/05/2017, 02/26/2018, 01/09/2019  . Influenza-Unspecified 01/22/2020  . PFIZER SARS-COV-2 Vaccination 06/30/2019, 07/25/2019  . Td 01/09/2019  . Tdap 10/13/2006    Health Maintenance  Topic Date Due  . PNEUMOCOCCAL POLYSACCHARIDE VACCINE AGE 65-64 HIGH RISK  Never done  . OPHTHALMOLOGY EXAM  Never done  . URINE MICROALBUMIN  Never done  . HEMOGLOBIN A1C  04/24/2020  . FOOT EXAM  01/29/2021  . MAMMOGRAM  06/25/2021  . PAP SMEAR-Modifier  01/08/2022  . COLONOSCOPY  03/04/2027  . TETANUS/TDAP  01/08/2029  . INFLUENZA VACCINE  Completed  . COVID-19 Vaccine  Completed  . Hepatitis C Screening  Completed  . HIV Screening  Completed     Discussed health benefits of physical activity, and encouraged her to engage in regular exercise appropriate for her age and condition.    Problem List Items Addressed This Visit      Cardiovascular and Mediastinum   Hypertension associated with diabetes (HCC)    Well controlled Continue current medications Recheck metabolic panel F/u in 6 months       Relevant Medications   verapamil (CALAN-SR) 240 MG CR tablet   verapamil (CALAN-SR) 120 MG CR tablet   Other Relevant Orders   Comprehensive metabolic panel     Endocrine   Hyperlipidemia associated with type 2 diabetes mellitus (HCC)    Not currently on a statin Discussed lower LDL goal of 70 in the setting  of diabetes Recheck FLP and CMP Likely to recommend Crestor pending results      Relevant Orders   Lipid panel   Comprehensive metabolic panel   P5KD (type 2 diabetes mellitus) (HCC)    Recent diagnosis with progression from prediabetes to diabetes Associated with and complicated by HTN and HLD Recheck A1c Diet controlled - on no medications Foot exam today Encouraged pneumovax Referral for eye exam Check urine microalbumin F/u in 6 months      Relevant Orders   Hemoglobin A1c   Ambulatory referral to Ophthalmology     Other   Anxiety    Chronic with recent worsening 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 Discussed using Klonopin sparingly and only as needed Discussed no increasing of number of pills per month Encourage SSRI, but patient declines Encourage therapy      Relevant Medications   clonazePAM (KLONOPIN) 0.5 MG tablet   Obesity    Discussed importance of healthy weight management Discussed diet and exercise       Relevant Orders   Hemoglobin A1c   Lipid panel   Comprehensive metabolic panel    Other Visit Diagnoses    Encounter for annual physical exam    -  Primary   Relevant Orders   Hemoglobin A1c   Lipid panel   Comprehensive metabolic panel       Return in about 6 months (around 07/30/2020) for chronic disease f/u.     I, Shirlee Latch, MD, have reviewed all documentation for this visit. The documentation on 01/30/20 for the exam, diagnosis, procedures, and orders are all accurate and complete.   Shanin Szymanowski, Marzella Schlein, MD, MPH [facility]

## 2020-01-30 NOTE — Assessment & Plan Note (Signed)
Not currently on a statin Discussed lower LDL goal of 70 in the setting of diabetes Recheck FLP and CMP Likely to recommend Crestor pending results

## 2020-01-30 NOTE — Patient Instructions (Addendum)
The CDC recommends two doses of Shingrix (the shingles vaccine) separated by 2 to 6 months for adults age 63 years and older. I recommend checking with your insurance plan regarding coverage for this vaccine.   Also recommend Pneumovax vaccine     Preventive Care 24-9 Years Old, Female Preventive care refers to visits with your health care provider and lifestyle choices that can promote health and wellness. This includes:  A yearly physical exam. This may also be called an annual well check.  Regular dental visits and eye exams.  Immunizations.  Screening for certain conditions.  Healthy lifestyle choices, such as eating a healthy diet, getting regular exercise, not using drugs or products that contain nicotine and tobacco, and limiting alcohol use. What can I expect for my preventive care visit? Physical exam Your health care provider will check your:  Height and weight. This may be used to calculate body mass index (BMI), which tells if you are at a healthy weight.  Heart rate and blood pressure.  Skin for abnormal spots. Counseling Your health care provider may ask you questions about your:  Alcohol, tobacco, and drug use.  Emotional well-being.  Home and relationship well-being.  Sexual activity.  Eating habits.  Work and work Statistician.  Method of birth control.  Menstrual cycle.  Pregnancy history. What immunizations do I need?  Influenza (flu) vaccine  This is recommended every year. Tetanus, diphtheria, and pertussis (Tdap) vaccine  You may need a Td booster every 10 years. Varicella (chickenpox) vaccine  You may need this if you have not been vaccinated. Zoster (shingles) vaccine  You may need this after age 76. Measles, mumps, and rubella (MMR) vaccine  You may need at least one dose of MMR if you were born in 1957 or later. You may also need a second dose. Pneumococcal conjugate (PCV13) vaccine  You may need this if you have certain  conditions and were not previously vaccinated. Pneumococcal polysaccharide (PPSV23) vaccine  You may need one or two doses if you smoke cigarettes or if you have certain conditions. Meningococcal conjugate (MenACWY) vaccine  You may need this if you have certain conditions. Hepatitis A vaccine  You may need this if you have certain conditions or if you travel or work in places where you may be exposed to hepatitis A. Hepatitis B vaccine  You may need this if you have certain conditions or if you travel or work in places where you may be exposed to hepatitis B. Haemophilus influenzae type b (Hib) vaccine  You may need this if you have certain conditions. Human papillomavirus (HPV) vaccine  If recommended by your health care provider, you may need three doses over 6 months. You may receive vaccines as individual doses or as more than one vaccine together in one shot (combination vaccines). Talk with your health care provider about the risks and benefits of combination vaccines. What tests do I need? Blood tests  Lipid and cholesterol levels. These may be checked every 5 years, or more frequently if you are over 17 years old.  Hepatitis C test.  Hepatitis B test. Screening  Lung cancer screening. You may have this screening every year starting at age 56 if you have a 30-pack-year history of smoking and currently smoke or have quit within the past 15 years.  Colorectal cancer screening. All adults should have this screening starting at age 60 and continuing until age 70. Your health care provider may recommend screening at age 16 if you  are at increased risk. You will have tests every 1-10 years, depending on your results and the type of screening test.  Diabetes screening. This is done by checking your blood sugar (glucose) after you have not eaten for a while (fasting). You may have this done every 1-3 years.  Mammogram. This may be done every 1-2 years. Talk with your health care  provider about when you should start having regular mammograms. This may depend on whether you have a family history of breast cancer.  BRCA-related cancer screening. This may be done if you have a family history of breast, ovarian, tubal, or peritoneal cancers.  Pelvic exam and Pap test. This may be done every 3 years starting at age 73. Starting at age 45, this may be done every 5 years if you have a Pap test in combination with an HPV test. Other tests  Sexually transmitted disease (STD) testing.  Bone density scan. This is done to screen for osteoporosis. You may have this scan if you are at high risk for osteoporosis. Follow these instructions at home: Eating and drinking  Eat a diet that includes fresh fruits and vegetables, whole grains, lean protein, and low-fat dairy.  Take vitamin and mineral supplements as recommended by your health care provider.  Do not drink alcohol if: ? Your health care provider tells you not to drink. ? You are pregnant, may be pregnant, or are planning to become pregnant.  If you drink alcohol: ? Limit how much you have to 0-1 drink a day. ? Be aware of how much alcohol is in your drink. In the [place] one drink equals one 12 oz bottle of beer (355 mL), one 5 oz glass of wine (148 mL), or one 1 oz glass of hard liquor (44 mL). Lifestyle  Take daily care of your teeth and gums.  Stay active. Exercise for at least 30 minutes on 5 or more days each week.  Do not use any products that contain nicotine or tobacco, such as cigarettes, e-cigarettes, and chewing tobacco. If you need help quitting, ask your health care provider.  If you are sexually active, practice safe sex. Use a condom or other form of birth control (contraception) in order to prevent pregnancy and STIs (sexually transmitted infections).  If told by your health care provider, take low-dose aspirin daily starting at age 27. What's next?  Visit your health care provider once a year for a  well check visit.  Ask your health care provider how often you should have your eyes and teeth checked.  Stay up to date on all vaccines. This information is not intended to replace advice given to you by your health care provider. Make sure you discuss any questions you have with your health care provider. Document Revised: 12/02/2017 Document Reviewed: 12/02/2017 Elsevier Patient Education  2020 [employer].

## 2020-01-30 NOTE — Assessment & Plan Note (Signed)
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

## 2020-01-30 NOTE — Assessment & Plan Note (Addendum)
Recent diagnosis with progression from prediabetes to diabetes Associated with and complicated by HTN and HLD Recheck A1c Diet controlled - on no medications Foot exam today Encouraged pneumovax Referral for eye exam Check urine microalbumin F/u in 6 months

## 2020-01-30 NOTE — Assessment & Plan Note (Signed)
Well controlled Continue current medications Recheck metabolic panel F/u in 6 months  

## 2020-01-31 LAB — COMPREHENSIVE METABOLIC PANEL
ALT: 14 IU/L (ref 0–32)
AST: 13 IU/L (ref 0–40)
Albumin/Globulin Ratio: 1.8 (ref 1.2–2.2)
Albumin: 4.6 g/dL (ref 3.8–4.8)
Alkaline Phosphatase: 90 IU/L (ref 44–121)
BUN/Creatinine Ratio: 16 (ref 12–28)
BUN: 15 mg/dL (ref 8–27)
Bilirubin Total: 0.3 mg/dL (ref 0.0–1.2)
CO2: 24 mmol/L (ref 20–29)
Calcium: 10 mg/dL (ref 8.7–10.3)
Chloride: 102 mmol/L (ref 96–106)
Creatinine, Ser: 0.91 mg/dL (ref 0.57–1.00)
GFR calc Af Amer: 78 mL/min/{1.73_m2} (ref >59)
GFR calc non Af Amer: 67 mL/min/{1.73_m2} (ref >59)
Globulin, Total: 2.5 g/dL (ref 1.5–4.5)
Glucose: 105 mg/dL — ABNORMAL HIGH (ref 65–99)
Potassium: 4.4 mmol/L (ref 3.5–5.2)
Sodium: 140 mmol/L (ref 134–144)
Total Protein: 7.1 g/dL (ref 6.0–8.5)

## 2020-01-31 LAB — LIPID PANEL
Chol/HDL Ratio: 6.2 ratio — ABNORMAL HIGH (ref 0.0–4.4)
Cholesterol, Total: 249 mg/dL — ABNORMAL HIGH (ref 100–199)
HDL: 40 mg/dL (ref >39)
LDL Chol Calc (NIH): 170 mg/dL — ABNORMAL HIGH (ref 0–99)
Triglycerides: 206 mg/dL — ABNORMAL HIGH (ref 0–149)
VLDL Cholesterol Cal: 39 mg/dL (ref 5–40)

## 2020-01-31 LAB — HEMOGLOBIN A1C
Est. average glucose Bld gHb Est-mCnc: 143 mg/dL
Hgb A1c MFr Bld: 6.6 % — ABNORMAL HIGH (ref 4.8–5.6)

## 2020-01-31 NOTE — Progress Notes (Signed)
E coli in urine culture, finish all keflex. Return per return precautions in office if any symptoms persisting.

## 2020-02-07 ENCOUNTER — Encounter: Payer: Self-pay | Admitting: Adult Health

## 2020-02-07 ENCOUNTER — Encounter: Payer: Self-pay | Admitting: Family Medicine

## 2020-02-07 ENCOUNTER — Other Ambulatory Visit: Payer: Self-pay | Admitting: Adult Health

## 2020-02-07 DIAGNOSIS — B9620 Unspecified Escherichia coli [E. coli] as the cause of diseases classified elsewhere: Secondary | ICD-10-CM

## 2020-02-07 DIAGNOSIS — N390 Urinary tract infection, site not specified: Secondary | ICD-10-CM

## 2020-02-07 MED ORDER — SULFAMETHOXAZOLE-TRIMETHOPRIM 800-160 MG PO TABS
1.0000 | ORAL_TABLET | Freq: Two times a day (BID) | ORAL | 0 refills | Status: DC
Start: 2020-02-07 — End: 2020-08-05

## 2020-02-27 DIAGNOSIS — Z1152 Encounter for screening for COVID-19: Secondary | ICD-10-CM | POA: Diagnosis not present

## 2020-02-27 DIAGNOSIS — Z03818 Encounter for observation for suspected exposure to other biological agents ruled out: Secondary | ICD-10-CM | POA: Diagnosis not present

## 2020-06-13 ENCOUNTER — Other Ambulatory Visit: Payer: Self-pay | Admitting: Family Medicine

## 2020-06-13 DIAGNOSIS — Z1231 Encounter for screening mammogram for malignant neoplasm of breast: Secondary | ICD-10-CM

## 2020-06-26 DIAGNOSIS — E119 Type 2 diabetes mellitus without complications: Secondary | ICD-10-CM | POA: Diagnosis not present

## 2020-06-26 LAB — HM DIABETES EYE EXAM

## 2020-07-04 ENCOUNTER — Other Ambulatory Visit: Payer: Self-pay

## 2020-07-04 ENCOUNTER — Encounter: Payer: Self-pay | Admitting: Family Medicine

## 2020-07-04 ENCOUNTER — Ambulatory Visit
Admission: RE | Admit: 2020-07-04 | Discharge: 2020-07-04 | Disposition: A | Discharge status: Home / Self Care | Payer: BC Managed Care – PPO | Source: Ambulatory Visit | Attending: Family Medicine | Admitting: Family Medicine

## 2020-07-04 DIAGNOSIS — Z1231 Encounter for screening mammogram for malignant neoplasm of breast: Secondary | ICD-10-CM | POA: Diagnosis not present

## 2020-07-08 ENCOUNTER — Telehealth: Payer: Self-pay

## 2020-07-08 NOTE — Telephone Encounter (Signed)
Patient was advised and verbalized understanding. 

## 2020-07-08 NOTE — Telephone Encounter (Signed)
-----   Message from Erasmo Downer, MD sent at 07/05/2020 10:43 AM EDT ----- Normal mammogram. Repeat in 1 yr

## 2020-07-18 ENCOUNTER — Other Ambulatory Visit: Payer: Self-pay | Admitting: Family Medicine

## 2020-08-02 NOTE — Patient Instructions (Signed)
Diabetes Mellitus and Nutrition, Adult When you have diabetes, or diabetes mellitus, it is very important to have healthy eating habits because your blood sugar (glucose) levels are greatly affected by what you eat and drink. Eating healthy foods in the right amounts, at about the same times every day, can help you:  Control your blood glucose.  Lower your risk of heart disease.  Improve your blood pressure.  Reach or maintain a healthy weight. What can affect my meal plan? Every person with diabetes is different, and each person has different needs for a meal plan. Your health care provider may recommend that you work with a dietitian to make a meal plan that is best for you. Your meal plan may vary depending on factors such as:  The calories you need.  The medicines you take.  Your weight.  Your blood glucose, blood pressure, and cholesterol levels.  Your activity level.  Other health conditions you have, such as heart or kidney disease. How do carbohydrates affect me? Carbohydrates, also called carbs, affect your blood glucose level more than any other type of food. Eating carbs naturally raises the amount of glucose in your blood. Carb counting is a method for keeping track of how many carbs you eat. Counting carbs is important to keep your blood glucose at a healthy level, especially if you use insulin or take certain oral diabetes medicines. It is important to know how many carbs you can safely have in each meal. This is different for every person. Your dietitian can help you calculate how many carbs you should have at each meal and for each snack. How does alcohol affect me? Alcohol can cause a sudden decrease in blood glucose (hypoglycemia), especially if you use insulin or take certain oral diabetes medicines. Hypoglycemia can be a life-threatening condition. Symptoms of hypoglycemia, such as sleepiness, dizziness, and confusion, are similar to symptoms of having too much  alcohol.  Do not drink alcohol if: ? Your health care provider tells you not to drink. ? You are pregnant, may be pregnant, or are planning to become pregnant.  If you drink alcohol: ? Do not drink on an empty stomach. ? Limit how much you use to:  0-1 drink a day for women.  0-2 drinks a day for men. ? Be aware of how much alcohol is in your drink. In the [place] one drink equals one 12 oz bottle of beer (355 mL), one 5 oz glass of wine (148 mL), or one 1 oz glass of hard liquor (44 mL). ? Keep yourself hydrated with water, diet soda, or unsweetened iced tea.  Keep in mind that regular soda, juice, and other mixers may contain a lot of sugar and must be counted as carbs. What are tips for following this plan? Reading food labels  Start by checking the serving size on the "Nutrition Facts" label of packaged foods and drinks. The amount of calories, carbs, fats, and other nutrients listed on the label is based on one serving of the item. Many items contain more than one serving per package.  Check the total grams (g) of carbs in one serving. You can calculate the number of servings of carbs in one serving by dividing the total carbs by 15. For example, if a food has 30 g of total carbs per serving, it would be equal to 2 servings of carbs.  Check the number of grams (g) of saturated fats and trans fats in one serving. Choose foods that have   a low amount or none of these fats.  Check the number of milligrams (mg) of salt (sodium) in one serving. Most people should limit total sodium intake to less than 2,300 mg per day.  Always check the nutrition information of foods labeled as "low-fat" or "nonfat." These foods may be higher in added sugar or refined carbs and should be avoided.  Talk to your dietitian to identify your daily goals for nutrients listed on the label. Shopping  Avoid buying canned, pre-made, or processed foods. These foods tend to be high in fat, sodium, and added  sugar.  Shop around the outside edge of the grocery store. This is where you will most often find fresh fruits and vegetables, bulk grains, fresh meats, and fresh dairy. Cooking  Use low-heat cooking methods, such as baking, instead of high-heat cooking methods like deep frying.  Cook using healthy oils, such as olive, canola, or sunflower oil.  Avoid cooking with butter, cream, or high-fat meats. Meal planning  Eat meals and snacks regularly, preferably at the same times every day. Avoid going long periods of time without eating.  Eat foods that are high in fiber, such as fresh fruits, vegetables, beans, and whole grains. Talk with your dietitian about how many servings of carbs you can eat at each meal.  Eat 4-6 oz (112-168 g) of lean protein each day, such as lean meat, chicken, fish, eggs, or tofu. One ounce (oz) of lean protein is equal to: ? 1 oz (28 g) of meat, chicken, or fish. ? 1 egg. ?  cup (62 g) of tofu.  Eat some foods each day that contain healthy fats, such as avocado, nuts, seeds, and fish.   What foods should I eat? Fruits Berries. Apples. Oranges. Peaches. Apricots. Plums. Grapes. Mango. Papaya. Pomegranate. Kiwi. Cherries. Vegetables Lettuce. Spinach. Leafy greens, including kale, chard, collard greens, and mustard greens. Beets. Cauliflower. Cabbage. Broccoli. Carrots. Green beans. Tomatoes. Peppers. Onions. Cucumbers. Brussels sprouts. Grains Whole grains, such as whole-wheat or whole-grain bread, crackers, tortillas, cereal, and pasta. Unsweetened oatmeal. Quinoa. Brown or wild rice. Meats and other proteins Seafood. Poultry without skin. Lean cuts of poultry and beef. Tofu. Nuts. Seeds. Dairy Low-fat or fat-free dairy products such as milk, yogurt, and cheese. The items listed above may not be a complete list of foods and beverages you can eat. Contact a dietitian for more information. What foods should I avoid? Fruits Fruits canned with  syrup. Vegetables Canned vegetables. Frozen vegetables with butter or cream sauce. Grains Refined white flour and flour products such as bread, pasta, snack foods, and cereals. Avoid all processed foods. Meats and other proteins Fatty cuts of meat. Poultry with skin. Breaded or fried meats. Processed meat. Avoid saturated fats. Dairy Full-fat yogurt, cheese, or milk. Beverages Sweetened drinks, such as soda or iced tea. The items listed above may not be a complete list of foods and beverages you should avoid. Contact a dietitian for more information. Questions to ask a health care provider  Do I need to meet with a diabetes educator?  Do I need to meet with a dietitian?  What number can I call if I have questions?  When are the best times to check my blood glucose? Where to find more information:  American Diabetes Association: diabetes.org  Academy of Nutrition and Dietetics: www.eatright.org  National Institute of Diabetes and Digestive and Kidney Diseases: www.niddk.nih.gov  Association of Diabetes Care and Education Specialists: www.diabeteseducator.org Summary  It is important to have healthy eating   habits because your blood sugar (glucose) levels are greatly affected by what you eat and drink.  A healthy meal plan will help you control your blood glucose and maintain a healthy lifestyle.  Your health care provider may recommend that you work with a dietitian to make a meal plan that is best for you.  Keep in mind that carbohydrates (carbs) and alcohol have immediate effects on your blood glucose levels. It is important to count carbs and to use alcohol carefully. This information is not intended to replace advice given to you by your health care provider. Make sure you discuss any questions you have with your health care provider. Document Revised: 02/28/2019 Document Reviewed: 02/28/2019 Elsevier Patient Education  2021 [employer].  

## 2020-08-05 ENCOUNTER — Other Ambulatory Visit: Payer: Self-pay

## 2020-08-05 ENCOUNTER — Ambulatory Visit (INDEPENDENT_AMBULATORY_CARE_PROVIDER_SITE_OTHER): Payer: BC Managed Care – PPO | Admitting: Family Medicine

## 2020-08-05 ENCOUNTER — Encounter: Payer: Self-pay | Admitting: Family Medicine

## 2020-08-05 VITALS — BP 132/80 | HR 87 | Temp 98.6°F | Resp 16 | Ht 69.0 in | Wt 225.7 lb

## 2020-08-05 DIAGNOSIS — M545 Low back pain, unspecified: Secondary | ICD-10-CM

## 2020-08-05 DIAGNOSIS — R82998 Other abnormal findings in urine: Secondary | ICD-10-CM

## 2020-08-05 DIAGNOSIS — E119 Type 2 diabetes mellitus without complications: Secondary | ICD-10-CM

## 2020-08-05 DIAGNOSIS — E785 Hyperlipidemia, unspecified: Secondary | ICD-10-CM | POA: Diagnosis not present

## 2020-08-05 DIAGNOSIS — F419 Anxiety disorder, unspecified: Secondary | ICD-10-CM

## 2020-08-05 DIAGNOSIS — I152 Hypertension secondary to endocrine disorders: Secondary | ICD-10-CM | POA: Diagnosis not present

## 2020-08-05 DIAGNOSIS — E1169 Type 2 diabetes mellitus with other specified complication: Secondary | ICD-10-CM

## 2020-08-05 DIAGNOSIS — E669 Obesity, unspecified: Secondary | ICD-10-CM | POA: Diagnosis not present

## 2020-08-05 DIAGNOSIS — E1159 Type 2 diabetes mellitus with other circulatory complications: Secondary | ICD-10-CM | POA: Diagnosis not present

## 2020-08-05 DIAGNOSIS — R809 Proteinuria, unspecified: Secondary | ICD-10-CM

## 2020-08-05 DIAGNOSIS — Z23 Encounter for immunization: Secondary | ICD-10-CM

## 2020-08-05 DIAGNOSIS — E1129 Type 2 diabetes mellitus with other diabetic kidney complication: Secondary | ICD-10-CM

## 2020-08-05 DIAGNOSIS — Z6833 Body mass index (BMI) 33.0-33.9, adult: Secondary | ICD-10-CM

## 2020-08-05 LAB — POCT URINALYSIS DIPSTICK
Bilirubin, UA: NEGATIVE
Blood, UA: NEGATIVE
Glucose, UA: NEGATIVE
Ketones, UA: NEGATIVE
Nitrite, UA: NEGATIVE
Protein, UA: NEGATIVE
Spec Grav, UA: 1.025 (ref 1.010–1.025)
Urobilinogen, UA: 0.2 E.U./dL
pH, UA: 6 (ref 5.0–8.0)

## 2020-08-05 LAB — POCT UA - MICROALBUMIN: Microalbumin Ur, POC: 100 mg/L

## 2020-08-05 LAB — POCT GLYCOSYLATED HEMOGLOBIN (HGB A1C)
Est. average glucose Bld gHb Est-mCnc: 134
Hemoglobin A1C: 6.3 % — AB (ref 4.0–5.6)

## 2020-08-05 MED ORDER — CLONAZEPAM 0.5 MG PO TABS: 0.2500 mg | ORAL_TABLET | Freq: Two times a day (BID) | ORAL | 5 refills | Status: DC | PRN

## 2020-08-05 MED ORDER — LISINOPRIL 5 MG PO TABS: 5.0000 mg | ORAL_TABLET | Freq: Every day | ORAL | 3 refills | Status: DC

## 2020-08-05 NOTE — Assessment & Plan Note (Signed)
New diagnosis with urine micro 100 today Start lisinopril 5mg  daily

## 2020-08-05 NOTE — Assessment & Plan Note (Signed)
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

## 2020-08-05 NOTE — Assessment & Plan Note (Signed)
Seems MSK in nature from lifting husband from the floor Is going to see chiropractor Scheduled tylenol, prn ibuprofen leuks in urine, so will send micro, but doubt back pain is from UTI given lack of other symptoms.

## 2020-08-05 NOTE — Assessment & Plan Note (Signed)
Reviewed last lipid panel Not currently on a statin Recheck FLP and CMP Discussed diet and exercise  

## 2020-08-05 NOTE — Progress Notes (Signed)
Established patient visit   Patient: Monica Hoffman   DOB: January 07, 1956   65 y.o. Female  MRN: [id number] Visit Date: 08/05/2020  Today's healthcare provider: Shirlee Latch, MD   Chief Complaint  Patient presents with  . Diabetes  . Hypertension  . Hyperlipidemia  . Back Pain   Subjective    Diabetes Pertinent negatives for hypoglycemia include no dizziness or headaches. Pertinent negatives for diabetes include no chest pain and no fatigue.  Hypertension Pertinent negatives include no chest pain, headaches or shortness of breath.  Hyperlipidemia Pertinent negatives include no chest pain or shortness of breath.  Back Pain Pertinent negatives include no abdominal pain, chest pain, dysuria, fever or headaches.    Diabetes Mellitus Type II, Follow-up  Lab Results  Component Value Date   HGBA1C 6.3 (A) 08/05/2020   HGBA1C 6.6 (H) 01/30/2020   HGBA1C 6.6 (H) 10/23/2019   Wt Readings from Last 3 Encounters:  08/05/20 225 lb 11.2 oz (102.4 kg)  01/30/20 229 lb (103.9 kg)  01/26/20 228 lb (103.4 kg)   Last seen for diabetes 6 months ago.  Management since then includes no changes. She reports excellent compliance with treatment. She is not having side effects.  Symptoms: Yes fatigue No foot ulcerations  No appetite changes No nausea  No paresthesia of the feet  No polydipsia  Yes polyuria No visual disturbances   No vomiting     Home blood sugar records: not being checked  Episodes of hypoglycemia? No    Current insulin regiment: none Most Recent Eye Exam: UTD Current exercise: walking Current diet habits: in general, a "healthy" diet  , well balanced  Pertinent Labs: Lab Results  Component Value Date   CHOL 249 (H) 01/30/2020   HDL 40 01/30/2020   LDLCALC 170 (H) 01/30/2020   TRIG 206 (H) 01/30/2020   CHOLHDL 6.2 (H) 01/30/2020   Lab Results  Component Value Date   NA 140 01/30/2020   K 4.4 01/30/2020   CREATININE 0.91 01/30/2020   GFRNONAA 67  01/30/2020   GFRAA 78 01/30/2020   GLUCOSE 105 (H) 01/30/2020     She states that she has been able to improve her condition due to her recent daily exercise of walking thirty minutes a day.  --------------------------------------------------------------------------------------------------- Hypertension, follow-up  BP Readings from Last 3 Encounters:  08/05/20 132/80  01/30/20 128/89  01/26/20 (!) 140/96   Wt Readings from Last 3 Encounters:  08/05/20 225 lb 11.2 oz (102.4 kg)  01/30/20 229 lb (103.9 kg)  01/26/20 228 lb (103.4 kg)     She was last seen for hypertension 6 months ago.  BP at that visit was 128/89. Management since that visit includes no changes.  She reports excellent compliance with treatment. She is not having side effects.  She is following a Regular diet. She is exercising. She does not smoke.  Use of agents associated with hypertension: none.   Outside blood pressures are not being checked. Symptoms: No chest pain No chest pressure  No palpitations No syncope  No dyspnea No orthopnea  No paroxysmal nocturnal dyspnea No lower extremity edema   Pertinent labs: Lab Results  Component Value Date   CHOL 249 (H) 01/30/2020   HDL 40 01/30/2020   LDLCALC 170 (H) 01/30/2020   TRIG 206 (H) 01/30/2020   CHOLHDL 6.2 (H) 01/30/2020   Lab Results  Component Value Date   NA 140 01/30/2020   K 4.4 01/30/2020   CREATININE 0.91 01/30/2020  GFRNONAA 67 01/30/2020   GFRAA 78 01/30/2020   GLUCOSE 105 (H) 01/30/2020     The 10-year ASCVD risk score Denman George(Goff DC Jr., et al., 2013) is: 16.1%   She states that since her last visit, she began walking thirty minutes a day. She has began drinking more water and participating in fasting that begins at 8:30 at night. She states that she has been able to maintain her blood pressure due to these activities.    --------------------------------------------------------------------------------------------------- Lipid/Cholesterol, Follow-up  Last lipid panel Other pertinent labs  Lab Results  Component Value Date   CHOL 249 (H) 01/30/2020   HDL 40 01/30/2020   LDLCALC 170 (H) 01/30/2020   TRIG 206 (H) 01/30/2020   CHOLHDL 6.2 (H) 01/30/2020   Lab Results  Component Value Date   ALT 14 01/30/2020   AST 13 01/30/2020   PLT 186 05/26/2007   TSH 1.820 10/23/2019     She was last seen for this 6 months ago.  Management since that visit includes no changes.  She reports excellent compliance with treatment. She is not having side effects.   Symptoms: No chest pain No chest pressure/discomfort  No dyspnea No lower extremity edema  Yes numbness or tingling of extremity No orthopnea  No palpitations No paroxysmal nocturnal dyspnea  No speech difficulty No syncope   Current diet: well balanced Current exercise: walking  The 10-year ASCVD risk score Denman George(Goff DC Jr., et al., 2013) is: 16.1%  --------------------------------------------------------------------------------------------------- Back Pain  She reports recurrent back pain. There was an injury that may have caused the pain. The most recent episode started a few weeks ago and is staying constant. The pain is located in the waistwith radiation. It is described as aching, stabbing and tingling, is moderate in intensity, occurring intermittently. Symptoms are worse in the: morning, afternoon, evening  Aggravating factors: sitting, standing and walking Relieving factors: sitting, standing and walking.  She has tried application of heat and application of ice with little relief.   Associated symptoms: Yes abdominal pain No bowel incontinence  No chest pain No dysuria   No fever No headaches  No joint pains Yes pelvic pain  No weakness in leg  No tingling in lower extremities  No urinary incontinence No weight loss   She continues to  have back pain. The pain exacerbated when she attempted to lift her husband an place him in a chair. The back pain is tender across the area of the lower back near the sacrum.  -----------------------------------------------------------------------------------------    Patient Active Problem List   Diagnosis Date Noted  . Acute bilateral low back pain without sciatica 08/05/2020  . Microalbuminuria due to type 2 diabetes mellitus (HCC) 08/05/2020  . Obesity 08/05/2018  . Anxiety 07/08/2015  . T2DM (type 2 diabetes mellitus) (HCC) 07/08/2015  . Hypertension associated with diabetes (HCC) 11/02/2008  . Hyperlipidemia associated with type 2 diabetes mellitus (HCC) 11/16/2007  . Inflammatory disease of breast 11/30/2006   Social History   Tobacco Use  . Smoking status: Never Smoker  . Smokeless tobacco: Never Used  Vaping Use  . Vaping Use: Never used  Substance Use Topics  . Alcohol use: Yes    Alcohol/week: 0.0 standard drinks    Comment: on occasion  . Drug use: Never   No Known Allergies     Medications: Outpatient Medications Prior to Visit  Medication Sig  . aspirin 81 MG tablet   . verapamil (CALAN-SR) 120 MG CR tablet Take 1 tablet (120 mg  total) by mouth daily.  . verapamil (CALAN-SR) 240 MG CR tablet TAKE ONE TABLET BY MOUTH AT BEDTIME  . [DISCONTINUED] clonazePAM (KLONOPIN) 0.5 MG tablet Take 0.5-1 tablets (0.25-0.5 mg total) by mouth 2 (two) times daily as needed. for anxiety  . [DISCONTINUED] sulfamethoxazole-trimethoprim (BACTRIM DS) 800-160 MG tablet Take 1 tablet by mouth 2 (two) times daily.   No facility-administered medications prior to visit.    Review of Systems  Constitutional: Negative for chills, fatigue and fever.  HENT: Negative for congestion, ear pain, rhinorrhea, sinus pain and sore throat.   Respiratory: Negative for cough, shortness of breath and wheezing.   Cardiovascular: Negative for chest pain and leg swelling.  Gastrointestinal:  Negative for abdominal pain, blood in stool, diarrhea, nausea and vomiting.  Genitourinary: Negative for dysuria, flank pain, frequency and urgency.  Musculoskeletal: Positive for back pain.  Neurological: Negative for dizziness and headaches.        Objective    BP 132/80 (BP Location: Left Arm, Patient Position: Sitting, Cuff Size: Large)   Pulse 87   Temp 98.6 F (37 C) (Oral)   Resp 16   Ht 5\' 9"  (1.753 m)   Wt 225 lb 11.2 oz (102.4 kg)   SpO2 99%   BMI 33.33 kg/m  BP Readings from Last 3 Encounters:  08/05/20 132/80  01/30/20 128/89  01/26/20 (!) 140/96   Wt Readings from Last 3 Encounters:  08/05/20 225 lb 11.2 oz (102.4 kg)  01/30/20 229 lb (103.9 kg)  01/26/20 228 lb (103.4 kg)      Physical Exam Vitals reviewed.  Constitutional:      General: She is not in acute distress.    Appearance: Normal appearance. She is well-developed. She is not diaphoretic.  HENT:     Head: Normocephalic and atraumatic.  Eyes:     General: No scleral icterus.    Conjunctiva/sclera: Conjunctivae normal.  Neck:     Thyroid: No thyromegaly.  Cardiovascular:     Rate and Rhythm: Normal rate and regular rhythm.     Pulses: Normal pulses.     Heart sounds: Normal heart sounds. No murmur heard. No friction rub. No gallop.   Pulmonary:     Effort: Pulmonary effort is normal. No respiratory distress.     Breath sounds: Normal breath sounds. No wheezing, rhonchi or rales.  Abdominal:     General: There is no distension.     Palpations: Abdomen is soft.     Tenderness: There is no abdominal tenderness. There is no right CVA tenderness or left CVA tenderness.  Musculoskeletal:        General: Tenderness (low back bilaterally. Neg SLR. antalgic gait) present. No swelling.     Cervical back: Neck supple.     Right lower leg: No edema.     Left lower leg: No edema.  Lymphadenopathy:     Cervical: No cervical adenopathy.  Skin:    General: Skin is warm and dry.     Coloration: Skin  is not jaundiced.     Findings: No rash.  Neurological:     Mental Status: She is alert and oriented to person, place, and time. Mental status is at baseline.  Psychiatric:        Mood and Affect: Mood normal.        Behavior: Behavior normal.     Results for orders placed or performed in visit on 08/05/20  POCT glycosylated hemoglobin (Hb A1C)  Result Value Ref Range   Hemoglobin  A1C 6.3 (A) 4.0 - 5.6 %   Est. average glucose Bld gHb Est-mCnc 134   POCT UA - Microalbumin  Result Value Ref Range   Microalbumin Ur, POC 100 mg/L    Assessment & Plan      Problem List Items Addressed This Visit      Cardiovascular and Mediastinum   Hypertension associated with diabetes (HCC)    Well controlled Continue current meds Recheck metabolic panel F/u in 6 months       Relevant Medications   lisinopril (ZESTRIL) 5 MG tablet   Other Relevant Orders   Comprehensive metabolic panel     Endocrine   Hyperlipidemia associated with type 2 diabetes mellitus (HCC)    Reviewed last lipid panel Not currently on a statin Recheck FLP and CMP Discussed diet and exercise       Relevant Medications   lisinopril (ZESTRIL) 5 MG tablet   Other Relevant Orders   Lipid panel   Comprehensive metabolic panel   U2VO (type 2 diabetes mellitus) (HCC) - Primary    Well controlled with A1c 6.3 Continue diet control Start ACEi today as above Assoc with HTN and HLD Pneumovax today UTD on eye and foot exams Encourage statin F/u in 6 motnhs with A1c      Relevant Medications   lisinopril (ZESTRIL) 5 MG tablet   Other Relevant Orders   POCT glycosylated hemoglobin (Hb A1C) (Completed)   POCT UA - Microalbumin (Completed)   Microalbuminuria due to type 2 diabetes mellitus (HCC)    New diagnosis with urine micro 100 today Start lisinopril 5mg  daily      Relevant Medications   lisinopril (ZESTRIL) 5 MG tablet     Other   Anxiety    Chronic and stable Exacerbated with caregiving for  husband with dementia and recent Continue Klonopin sparingly and only prn No increase in # of pills Encourage SSRI, but patient declines Encourage therapy      Relevant Medications   clonazePAM (KLONOPIN) 0.5 MG tablet   Obesity    Discussed importance of healthy weight management Discussed diet and exercise       Acute bilateral low back pain without sciatica    Seems MSK in nature from lifting husband from the floor Is going to see chiropractor Scheduled tylenol, prn ibuprofen leuks in urine, so will send micro, but doubt back pain is from UTI given lack of other symptoms.      Relevant Orders   POCT urinalysis dipstick    Other Visit Diagnoses    Need for 23-polyvalent pneumococcal polysaccharide vaccine       Leukocytes in urine       Relevant Orders   Urine Culture       Return in about 6 months (around 02/05/2021) for CPE.      13/05/2020 [place],acting as a Danelle Earthly for Neurosurgeon, MD.,have documented all relevant documentation on the behalf of Shirlee Latch, MD,as directed by  Shirlee Latch, MD while in the presence of Shirlee Latch, MD.  I, Shirlee Latch, MD, have reviewed all documentation for this visit. The documentation on 08/05/20 for the exam, diagnosis, procedures, and orders are all accurate and complete.   Abryana Lykens, 10/05/20, MD, MPH [facility]

## 2020-08-05 NOTE — Assessment & Plan Note (Signed)
Discussed importance of healthy weight management Discussed diet and exercise  

## 2020-08-05 NOTE — Assessment & Plan Note (Addendum)
Well controlled with A1c 6.3 Continue diet control Start ACEi today as above Assoc with HTN and HLD Pneumovax today UTD on eye and foot exams Encourage statin F/u in 6 motnhs with A1c

## 2020-08-05 NOTE — Assessment & Plan Note (Addendum)
Well controlled Continue current meds Recheck metabolic panel F/u in 6 months

## 2020-08-06 ENCOUNTER — Telehealth: Payer: Self-pay

## 2020-08-06 DIAGNOSIS — E1159 Type 2 diabetes mellitus with other circulatory complications: Secondary | ICD-10-CM

## 2020-08-06 DIAGNOSIS — I152 Hypertension secondary to endocrine disorders: Secondary | ICD-10-CM

## 2020-08-06 LAB — LIPID PANEL
Chol/HDL Ratio: 5.6 ratio — ABNORMAL HIGH (ref 0.0–4.4)
Cholesterol, Total: 262 mg/dL — ABNORMAL HIGH (ref 100–199)
HDL: 47 mg/dL (ref >39)
LDL Chol Calc (NIH): 178 mg/dL — ABNORMAL HIGH (ref 0–99)
Triglycerides: 199 mg/dL — ABNORMAL HIGH (ref 0–149)
VLDL Cholesterol Cal: 37 mg/dL (ref 5–40)

## 2020-08-06 LAB — COMPREHENSIVE METABOLIC PANEL
ALT: 17 IU/L (ref 0–32)
AST: 19 IU/L (ref 0–40)
Albumin/Globulin Ratio: 1.8 (ref 1.2–2.2)
Albumin: 4.7 g/dL (ref 3.8–4.8)
Alkaline Phosphatase: 91 IU/L (ref 44–121)
BUN/Creatinine Ratio: 15 (ref 12–28)
BUN: 16 mg/dL (ref 8–27)
Bilirubin Total: 0.5 mg/dL (ref 0.0–1.2)
CO2: 25 mmol/L (ref 20–29)
Calcium: 9.6 mg/dL (ref 8.7–10.3)
Chloride: 104 mmol/L (ref 96–106)
Creatinine, Ser: 1.07 mg/dL — ABNORMAL HIGH (ref 0.57–1.00)
Globulin, Total: 2.6 g/dL (ref 1.5–4.5)
Glucose: 141 mg/dL — ABNORMAL HIGH (ref 65–99)
Potassium: 4.5 mmol/L (ref 3.5–5.2)
Sodium: 143 mmol/L (ref 134–144)
Total Protein: 7.3 g/dL (ref 6.0–8.5)
eGFR: 58 mL/min/{1.73_m2} — ABNORMAL LOW (ref >59)

## 2020-08-06 NOTE — Telephone Encounter (Signed)
Patient advised. She will start with diet and exercise.

## 2020-08-06 NOTE — Telephone Encounter (Signed)
-----   Message from Erasmo Downer, MD sent at 08/06/2020  8:54 AM EDT ----- Cholesterol is stably high. The 10-year ASCVD (heart disease and stroke) risk score Denman George DC Jr., et al., 2013) is: 15.3%, which is high.  Given this high risk of heart disease and stroke and diabetes, will again recommend statin to lower this risk.  If patient is agreeable, can prescribe Crestor 5 mg daily.  Kidney function is slightly elevated today.  Recommend hydrating well and recheck in 1 month.

## 2020-08-07 LAB — URINE CULTURE

## 2020-08-19 DIAGNOSIS — M5413 Radiculopathy, cervicothoracic region: Secondary | ICD-10-CM | POA: Diagnosis not present

## 2020-08-19 DIAGNOSIS — M9901 Segmental and somatic dysfunction of cervical region: Secondary | ICD-10-CM | POA: Diagnosis not present

## 2020-08-19 DIAGNOSIS — M5451 Vertebrogenic low back pain: Secondary | ICD-10-CM | POA: Diagnosis not present

## 2020-08-19 DIAGNOSIS — M9903 Segmental and somatic dysfunction of lumbar region: Secondary | ICD-10-CM | POA: Diagnosis not present

## 2020-08-20 DIAGNOSIS — M9903 Segmental and somatic dysfunction of lumbar region: Secondary | ICD-10-CM | POA: Diagnosis not present

## 2020-08-20 DIAGNOSIS — M9901 Segmental and somatic dysfunction of cervical region: Secondary | ICD-10-CM | POA: Diagnosis not present

## 2020-08-20 DIAGNOSIS — M5451 Vertebrogenic low back pain: Secondary | ICD-10-CM | POA: Diagnosis not present

## 2020-08-20 DIAGNOSIS — M5413 Radiculopathy, cervicothoracic region: Secondary | ICD-10-CM | POA: Diagnosis not present

## 2020-08-28 DIAGNOSIS — S8002XA Contusion of left knee, initial encounter: Secondary | ICD-10-CM | POA: Diagnosis not present

## 2020-08-28 DIAGNOSIS — S8001XA Contusion of right knee, initial encounter: Secondary | ICD-10-CM | POA: Diagnosis not present

## 2020-08-28 DIAGNOSIS — S40011A Contusion of right shoulder, initial encounter: Secondary | ICD-10-CM | POA: Diagnosis not present

## 2020-09-02 ENCOUNTER — Other Ambulatory Visit: Payer: Self-pay | Admitting: Family Medicine

## 2021-02-10 ENCOUNTER — Ambulatory Visit: Payer: Self-pay | Admitting: Family Medicine

## 2021-02-15 ENCOUNTER — Other Ambulatory Visit: Payer: Self-pay | Admitting: Family Medicine

## 2021-02-15 NOTE — Telephone Encounter (Signed)
Requested Prescriptions  Pending Prescriptions Disp Refills  . verapamil (CALAN-SR) 240 MG CR tablet [Pharmacy Med Name: VERAPAMIL HCL ER 240 MG TAB] 90 tablet 0    Sig: TAKE ONE TABLET BY MOUTH AT BEDTIME     Cardiovascular:  Calcium Channel Blockers Failed - 02/15/2021 11:09 AM      Failed - Valid encounter within last 6 months    Recent Outpatient Visits          6 months ago Type 2 diabetes mellitus without complication, without long-term current use of insulin [facility])   [facility] [place], Marzella Schlein, [place]   1 year ago Encounter for annual physical exam   [facility] [place], Marzella Schlein, [place]   1 year ago Urinary tract infection with hematuria, site unspecified   [facility] [place], Eula Fried, FNP   1 year ago Type 2 diabetes mellitus without complication, without long-term current use of insulin [facility])   [facility] [place], Alessandra Bevels, [place]   1 year ago Essential (primary) hypertension   [facility] [place], Marzella Schlein, MD      Future Appointments            In 2 weeks [place], Marzella Schlein, MD [facility]           Passed - Last BP in normal range    BP Readings from Last 1 Encounters:  08/05/20 132/80

## 2021-03-04 ENCOUNTER — Other Ambulatory Visit: Payer: Self-pay

## 2021-03-04 ENCOUNTER — Ambulatory Visit (INDEPENDENT_AMBULATORY_CARE_PROVIDER_SITE_OTHER): Payer: BC Managed Care – PPO | Admitting: Family Medicine

## 2021-03-04 ENCOUNTER — Encounter: Payer: Self-pay | Admitting: Family Medicine

## 2021-03-04 VITALS — BP 118/86 | HR 99 | Temp 97.6°F | Resp 16 | Ht 69.0 in | Wt 224.0 lb

## 2021-03-04 DIAGNOSIS — E669 Obesity, unspecified: Secondary | ICD-10-CM

## 2021-03-04 DIAGNOSIS — F419 Anxiety disorder, unspecified: Secondary | ICD-10-CM

## 2021-03-04 DIAGNOSIS — E785 Hyperlipidemia, unspecified: Secondary | ICD-10-CM | POA: Diagnosis not present

## 2021-03-04 DIAGNOSIS — Z6833 Body mass index (BMI) 33.0-33.9, adult: Secondary | ICD-10-CM

## 2021-03-04 DIAGNOSIS — Z0000 Encounter for general adult medical examination without abnormal findings: Secondary | ICD-10-CM | POA: Diagnosis not present

## 2021-03-04 DIAGNOSIS — I152 Hypertension secondary to endocrine disorders: Secondary | ICD-10-CM | POA: Diagnosis not present

## 2021-03-04 DIAGNOSIS — E1159 Type 2 diabetes mellitus with other circulatory complications: Secondary | ICD-10-CM

## 2021-03-04 DIAGNOSIS — E1169 Type 2 diabetes mellitus with other specified complication: Secondary | ICD-10-CM | POA: Diagnosis not present

## 2021-03-04 DIAGNOSIS — E1129 Type 2 diabetes mellitus with other diabetic kidney complication: Secondary | ICD-10-CM

## 2021-03-04 DIAGNOSIS — R809 Proteinuria, unspecified: Secondary | ICD-10-CM

## 2021-03-04 MED ORDER — CLONAZEPAM 0.5 MG PO TABS: 0.2500 mg | ORAL_TABLET | Freq: Two times a day (BID) | ORAL | 5 refills | Status: DC | PRN

## 2021-03-04 NOTE — Progress Notes (Addendum)
Complete physical exam   Patient: Monica Hoffman   DOB: [birth year]   65 y.o. Female  MRN: [id number] Visit Date: 03/04/2021  Today's healthcare provider: Lavon Paganini, MD   Chief Complaint  Patient presents with   Annual Exam   Subjective    Monica Hoffman is a 65 y.o. female who presents today for a complete physical exam.  She reports consuming a general diet. Home exercise routine includes stretching. She generally feels well. She reports sleeping well. She does not have additional problems to discuss today.  HPI    Fall in 5/22 in a flooring store. Saw Dr Raliegh Ip at [facility]. She use NSAIDs, compression, rest. She is still taking Mobic for OA of both knees. This caused her to decrease exercise.  Past Medical History:  Diagnosis Date   Anxiety    Hyperglycemia    Hyperlipidemia    Hypertension    Past Surgical History:  Procedure Laterality Date   ABDOMINAL HYSTERECTOMY  2010   still has ovaries   Social History   Socioeconomic History   Marital status: Married    Spouse name: Not on file   Number of children: Not on file   Years of education: Not on file   Highest education level: Not on file  Occupational History   Not on file  Tobacco Use   Smoking status: Never   Smokeless tobacco: Never  Vaping Use   Vaping Use: Never used  Substance and Sexual Activity   Alcohol use: Yes    Alcohol/week: 0.0 standard drinks    Comment: on occasion   Drug use: Never   Sexual activity: Not on file  Other Topics Concern   Not on file  Social History Narrative   Not on file   Social Determinants of Health   Financial Resource Strain: Not on file  Food Insecurity: Not on file  Transportation Needs: Not on file  Physical Activity: Not on file  Stress: Not on file  Social Connections: Not on file  Intimate Partner Violence: Not on file   Family Status  Relation Name Status   Mother  Deceased at age 47       died from brain tumor   Father  Deceased  at age 62   Sister  63   Brother  Deceased at age 71       stroke   Sister  Comptroller   Sister  Alive   Brother  Alive   Brother  Alive   Neg Hx  (Not Specified)   Family History  Problem Relation Age of Onset   Brain cancer Mother    Diabetes Mother    COPD Father    Diabetes Father    Stroke Brother    Leukemia Brother    Breast cancer Neg Hx    No Known Allergies  Patient Care Team: Virginia Crews, MD as PCP - General (Family Medicine)   Medications: Outpatient Medications Prior to Visit  Medication Sig   aspirin 81 MG tablet    lisinopril (ZESTRIL) 5 MG tablet Take 1 tablet (5 mg total) by mouth daily.   meloxicam (MOBIC) 7.5 MG tablet SMARTSIG:1 Tablet(s) By Mouth Every 12 Hours   verapamil (CALAN-SR) 120 MG CR tablet TAKE ONE TABLET BY MOUTH EVERY DAY   verapamil (CALAN-SR) 240 MG CR tablet TAKE ONE TABLET BY MOUTH AT BEDTIME   [DISCONTINUED] clonazePAM (KLONOPIN) 0.5 MG tablet Take 0.5-1 tablets (0.25-0.5 mg total) by mouth 2 (  two) times daily as needed. for anxiety   No facility-administered medications prior to visit.    Review of Systems  Constitutional: Negative.   HENT: Negative.    Eyes: Negative.   Respiratory: Negative.    Cardiovascular: Negative.   Gastrointestinal: Negative.   Endocrine: Negative.   Genitourinary: Negative.   Musculoskeletal: Negative.   Skin: Negative.   Allergic/Immunologic: Negative.   Neurological: Negative.   Psychiatric/Behavioral: Negative.     Last CBC Lab Results  Component Value Date   WBC 14.2 (H) 05/26/2007   HGB 11.8 DELTA CHECK NOTED (L) 05/26/2007   HCT 33.8 (L) 05/26/2007   MCV 87.0 05/26/2007   RDW 13.5 05/26/2007   PLT 186 85/63/1497   Last metabolic panel Lab Results  Component Value Date   GLUCOSE 141 (H) 08/05/2020   NA 143 08/05/2020   K 4.5 08/05/2020   CL 104 08/05/2020   CO2 25 08/05/2020   BUN 16 08/05/2020   CREATININE 1.07 (H) 08/05/2020   EGFR 58 (L) 08/05/2020   CALCIUM 9.6  08/05/2020   PROT 7.3 08/05/2020   ALBUMIN 4.7 08/05/2020   LABGLOB 2.6 08/05/2020   AGRATIO 1.8 08/05/2020   BILITOT 0.5 08/05/2020   ALKPHOS 91 08/05/2020   AST 19 08/05/2020   ALT 17 08/05/2020   Last lipids Lab Results  Component Value Date   CHOL 262 (H) 08/05/2020   HDL 47 08/05/2020   LDLCALC 178 (H) 08/05/2020   TRIG 199 (H) 08/05/2020   CHOLHDL 5.6 (H) 08/05/2020   Last hemoglobin A1c Lab Results  Component Value Date   HGBA1C 6.3 (A) 08/05/2020   Last thyroid functions Lab Results  Component Value Date   TSH 1.820 10/23/2019   T4TOTAL 7.4 10/23/2019      Objective    BP 118/86 (BP Location: Right Arm, Patient Position: Sitting, Cuff Size: Large)   Pulse 99   Temp 97.6 F (36.4 C) (Temporal)   Resp 16   Ht _0  (1.753 m)   Wt 224 lb (101.6 kg)   SpO2 99%   BMI 33.08 kg/m  BP Readings from Last 3 Encounters:  03/04/21 118/86  08/05/20 132/80  01/30/20 128/89   Wt Readings from Last 3 Encounters:  03/04/21 224 lb (101.6 kg)  08/05/20 225 lb 11.2 oz (102.4 kg)  01/30/20 229 lb (103.9 kg)      Physical Exam Vitals reviewed.  Constitutional:      General: She is not in acute distress.    Appearance: Normal appearance. She is well-developed. She is not diaphoretic.  HENT:     Head: Normocephalic and atraumatic.     Right Ear: Tympanic membrane, ear canal and external ear normal.     Left Ear: Tympanic membrane, ear canal and external ear normal.     Nose: Nose normal.     Mouth/Throat:     Mouth: Mucous membranes are moist.     Pharynx: Oropharynx is clear. No oropharyngeal exudate.  Eyes:     General: No scleral icterus.    Conjunctiva/sclera: Conjunctivae normal.     Pupils: Pupils are equal, round, and reactive to light.  Neck:     Thyroid: No thyromegaly.  Cardiovascular:     Rate and Rhythm: Normal rate and regular rhythm.     Pulses: Normal pulses.     Heart sounds: Normal heart sounds. No murmur heard. Pulmonary:     Effort:  Pulmonary effort is normal. No respiratory distress.     Breath sounds:  Normal breath sounds. No wheezing or rales.  Abdominal:     General: There is no distension.     Palpations: Abdomen is soft.     Tenderness: There is no abdominal tenderness.  Musculoskeletal:        General: No deformity.     Cervical back: Neck supple.     Right lower leg: No edema.     Left lower leg: No edema.  Lymphadenopathy:     Cervical: No cervical adenopathy.  Skin:    General: Skin is warm and dry.     Findings: No rash.  Neurological:     Mental Status: She is alert and oriented to person, place, and time. Mental status is at baseline.     Sensory: No sensory deficit.     Motor: No weakness.     Gait: Gait normal.  Psychiatric:        Mood and Affect: Mood normal.        Behavior: Behavior normal.        Thought Content: Thought content normal.     Diabetic Foot Exam - Simple   Simple Foot Form Diabetic Foot exam was performed with the following findings: Yes 03/04/2021  8:37 AM  Visual Inspection No deformities, no ulcerations, no other skin breakdown bilaterally: Yes Sensation Testing Intact to touch and monofilament testing bilaterally: Yes Pulse Check Posterior Tibialis and Dorsalis pulse intact bilaterally: Yes Comments      Last depression screening scores PHQ 2/9 Scores 03/04/2021 08/05/2020 01/30/2020  PHQ - 2 Score 0 0 0  PHQ- 9 Score 2 6 0   Last fall risk screening Fall Risk  03/04/2021  Falls in the past year? 1  Number falls in past yr: 0  Injury with Fall? 1  Risk for fall due to : History of fall(s)  Follow up Falls evaluation completed;Education provided;Falls prevention discussed   Last Audit-C alcohol use screening Alcohol Use Disorder Test (AUDIT) 03/04/2021  1. How often do you have a drink containing alcohol? 2  2. How many drinks containing alcohol do you have on a typical day when you are drinking? 0  3. How often do you have six or more drinks on one  occasion? 0  AUDIT-C Score 2  Alcohol Brief Interventions/Follow-up -   A score of 3 or more in women, and 4 or more in men indicates increased risk for alcohol abuse, EXCEPT if all of the points are from question 1   No results found for any visits on 03/04/21.  Assessment & Plan    Routine Health Maintenance and Physical Exam  Exercise Activities and Dietary recommendations  Goals   None     Immunization History  Administered Date(s) Administered   Influenza Split 03/13/2011, 12/18/2011   Influenza,inj,Quad PF,6+ Mos 01/30/2013, 04/23/2014, 01/06/2016, 02/05/2017, 02/26/2018, 01/09/2019   Influenza-Unspecified 01/22/2020, 02/03/2021   PFIZER(Purple Top)SARS-COV-2 Vaccination 06/30/2019, 07/25/2019, 02/16/2020   Pneumococcal Polysaccharide-23 08/05/2020   Td 01/09/2019   Tdap 10/13/2006    Health Maintenance  Topic Date Due   Zoster Vaccines- Shingrix (1 of 2) Never done   COVID-19 Vaccine (4 - Booster for Pfizer series) 04/12/2020   HEMOGLOBIN A1C  02/05/2021   OPHTHALMOLOGY EXAM  06/26/2021   Pneumococcal Vaccine 49-60 Years old (2 - PCV) 08/05/2021   FOOT EXAM  03/04/2022   MAMMOGRAM  07/05/2022   PAP SMEAR-Modifier  01/09/2024   COLONOSCOPY (Pts 45-54yr Insurance coverage will need to be confirmed)  03/04/2027   TETANUS/TDAP  01/08/2029  INFLUENZA VACCINE  Completed   Hepatitis C Screening  Completed   HIV Screening  Completed   HPV VACCINES  Aged Out    Discussed health benefits of physical activity, and encouraged her to engage in regular exercise appropriate for her age and condition.  Problem List Items Addressed This Visit       Cardiovascular and Mediastinum   Hypertension associated with diabetes ([place])    Well controlled Continue current medications Recheck metabolic panel F/u in 6 months       Relevant Orders   Comprehensive metabolic panel     Endocrine   Hyperlipidemia associated with type 2 diabetes mellitus ([place])    Reviewed last  lipid panel Not currently on a statin Recheck FLP and CMP Discussed diet and exercise       Relevant Orders   Comprehensive metabolic panel   Lipid panel   T2DM (type 2 diabetes mellitus) ([place])    Previously well controlled with diet Recheck A1c UTD on vaccines, eye exam, foot exam On ACEi Not on Statin Discussed diet and exercise F/u in 6 months       Relevant Orders   Hemoglobin A1c   Urine Microalbumin w/creat. ratio   Microalbuminuria due to type 2 diabetes mellitus ([facility])    Continue low dose lisinopril  Recheck urine microalbumin/creatinine ratio      Relevant Orders   Urine Microalbumin w/creat. ratio     Other   Anxiety    Chronic and stable Continue klonopin sparingly and only prn - will not increase # of pills or strength Have discussed SSRI - patient declined Encourage therapy      Relevant Medications   clonazePAM (KLONOPIN) 0.5 MG tablet   Obesity    Discussed importance of healthy weight management Discussed diet and exercise       Other Visit Diagnoses     Encounter for annual physical exam    -  Primary   Relevant Orders   Comprehensive metabolic panel   Lipid panel   Hemoglobin A1c        Return in about 6 months (around 09/01/2021) for chronic disease f/u.     I, Lavon Paganini, MD, have reviewed all documentation for this visit. The documentation on 03/04/21 for the exam, diagnosis, procedures, and orders are all accurate and complete.   Hani Campusano, Dionne Bucy, MD, MPH [facility]

## 2021-03-04 NOTE — Assessment & Plan Note (Signed)
Reviewed last lipid panel Not currently on a statin Recheck FLP and CMP Discussed diet and exercise  

## 2021-03-04 NOTE — Assessment & Plan Note (Signed)
Continue low dose lisinopril  Recheck urine microalbumin/creatinine ratio

## 2021-03-04 NOTE — Assessment & Plan Note (Signed)
Well controlled Continue current medications Recheck metabolic panel F/u in 6 months  

## 2021-03-04 NOTE — Assessment & Plan Note (Signed)
Previously well controlled with diet Recheck A1c UTD on vaccines, eye exam, foot exam On ACEi Not on Statin Discussed diet and exercise F/u in 6 months

## 2021-03-04 NOTE — Patient Instructions (Signed)
   The CDC recommends two doses of Shingrix (the shingles vaccine) separated by 2 to 6 months for adults age 64 years and older. I recommend checking with your insurance plan regarding coverage for this vaccine.   

## 2021-03-04 NOTE — Assessment & Plan Note (Signed)
Chronic and stable Continue klonopin sparingly and only prn - will not increase # of pills or strength Have discussed SSRI - patient declined Encourage therapy 

## 2021-03-04 NOTE — Assessment & Plan Note (Signed)
Discussed importance of healthy weight management Discussed diet and exercise  

## 2021-03-05 LAB — COMPREHENSIVE METABOLIC PANEL
ALT: 16 IU/L (ref 0–32)
AST: 16 IU/L (ref 0–40)
Albumin/Globulin Ratio: 2.1 (ref 1.2–2.2)
Albumin: 4.7 g/dL (ref 3.8–4.8)
Alkaline Phosphatase: 85 IU/L (ref 44–121)
BUN/Creatinine Ratio: 14 (ref 12–28)
BUN: 15 mg/dL (ref 8–27)
Bilirubin Total: 0.3 mg/dL (ref 0.0–1.2)
CO2: 23 mmol/L (ref 20–29)
Calcium: 10 mg/dL (ref 8.7–10.3)
Chloride: 106 mmol/L (ref 96–106)
Creatinine, Ser: 1.11 mg/dL — ABNORMAL HIGH (ref 0.57–1.00)
Globulin, Total: 2.2 g/dL (ref 1.5–4.5)
Glucose: 139 mg/dL — ABNORMAL HIGH (ref 70–99)
Potassium: 5 mmol/L (ref 3.5–5.2)
Sodium: 144 mmol/L (ref 134–144)
Total Protein: 6.9 g/dL (ref 6.0–8.5)
eGFR: 56 mL/min/{1.73_m2} — ABNORMAL LOW (ref >59)

## 2021-03-05 LAB — LIPID PANEL
Chol/HDL Ratio: 5.7 ratio — ABNORMAL HIGH (ref 0.0–4.4)
Cholesterol, Total: 264 mg/dL — ABNORMAL HIGH (ref 100–199)
HDL: 46 mg/dL (ref >39)
LDL Chol Calc (NIH): 177 mg/dL — ABNORMAL HIGH (ref 0–99)
Triglycerides: 216 mg/dL — ABNORMAL HIGH (ref 0–149)
VLDL Cholesterol Cal: 41 mg/dL — ABNORMAL HIGH (ref 5–40)

## 2021-03-05 LAB — MICROALBUMIN / CREATININE URINE RATIO
Creatinine, Urine: 260.9 mg/dL
Microalb/Creat Ratio: 11 mg/g creat (ref 0–29)
Microalbumin, Urine: 29.3 ug/mL

## 2021-03-05 LAB — HEMOGLOBIN A1C
Est. average glucose Bld gHb Est-mCnc: 148 mg/dL
Hgb A1c MFr Bld: 6.8 % — ABNORMAL HIGH (ref 4.8–5.6)

## 2021-03-07 ENCOUNTER — Telehealth: Payer: Self-pay

## 2021-03-07 MED ORDER — VERAPAMIL HCL ER 120 MG PO TBCR: 120.0000 mg | EXTENDED_RELEASE_TABLET | Freq: Every day | ORAL | 1 refills | Status: DC

## 2021-03-07 MED ORDER — ROSUVASTATIN CALCIUM 5 MG PO TABS: 5.0000 mg | ORAL_TABLET | Freq: Every day | ORAL | 3 refills | Status: DC

## 2021-03-07 NOTE — Telephone Encounter (Signed)
-----   Message from Erasmo Downer, MD sent at 03/07/2021 12:15 PM EST ----- Normal/stable labs, except for high cholesterol. Recommend statin to lower heart disease and stroke risk.  If patient agrees, can send in Crestor 5mg  daily

## 2021-04-03 ENCOUNTER — Telehealth: Payer: Self-pay

## 2021-04-03 NOTE — Telephone Encounter (Signed)
Copied from CRM [id number]. Topic: General - Other >> Apr 03, 2021 11:31 AM Wyonia Hough E wrote: Reason for CRM: Pt called to let office know that until July when she is on medicare she will be using [facility] on [address] for her preferred pharmacy / [place]

## 2021-04-04 NOTE — Telephone Encounter (Signed)
Pharmacy updated on chart.

## 2021-04-08 NOTE — Telephone Encounter (Signed)
Patient came by the office to tell us that she can stay with [facility] for her medications.  Noted in chart and corrected pharmacy.

## 2021-05-27 ENCOUNTER — Other Ambulatory Visit: Payer: Self-pay | Admitting: Family Medicine

## 2021-05-27 DIAGNOSIS — Z1231 Encounter for screening mammogram for malignant neoplasm of breast: Secondary | ICD-10-CM

## 2021-06-20 IMAGING — MG DIGITAL SCREENING BILAT W/ TOMO W/ CAD
8 series · 8 of 24 positions shown · non-contrast
Comparison: Previous exam(s).

CLINICAL DATA: Screening.

EXAM:
DIGITAL SCREENING BILATERAL MAMMOGRAM WITH TOMO AND CAD

[R MLO synth-2D]
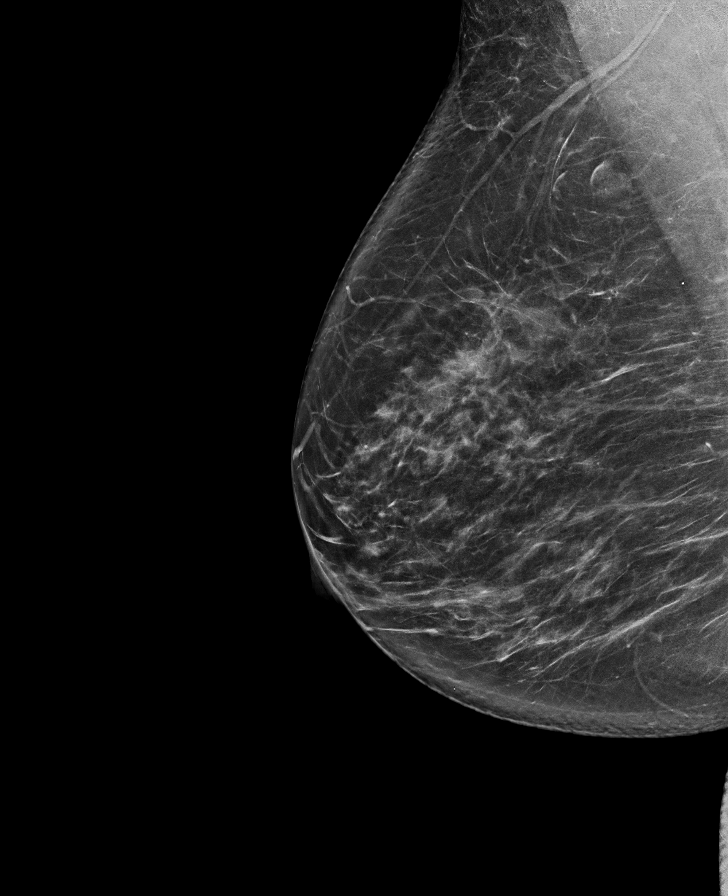

[L CC synth-2D]
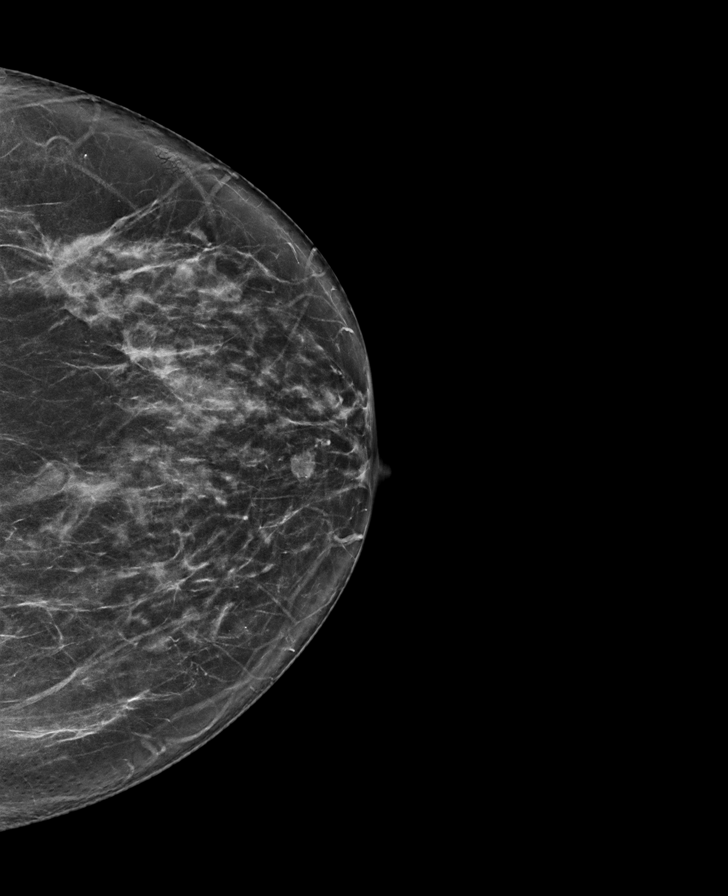

[R CC synth-2D]
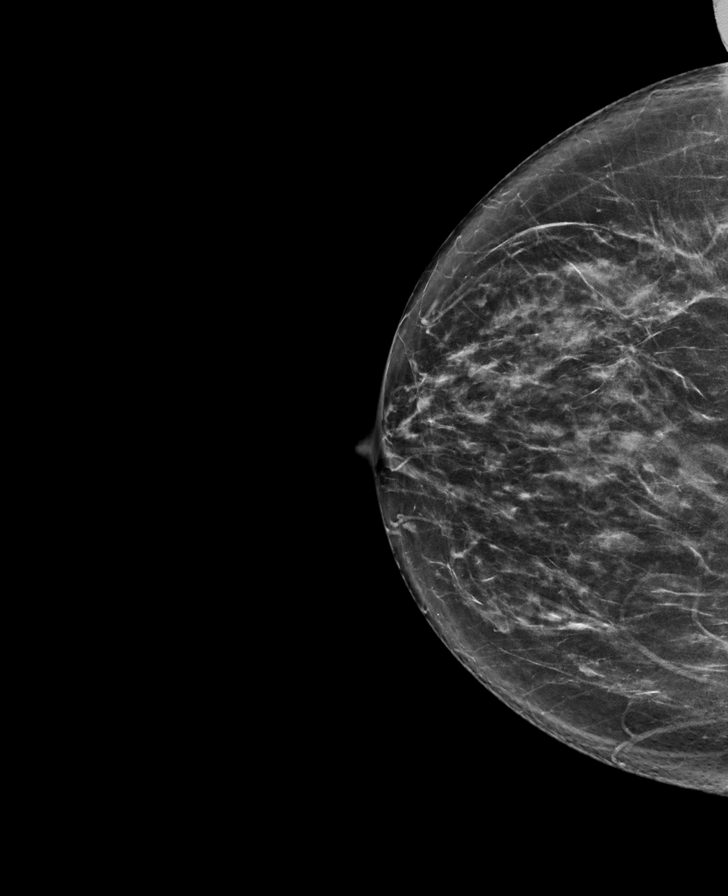

[L MLO synth-2D]
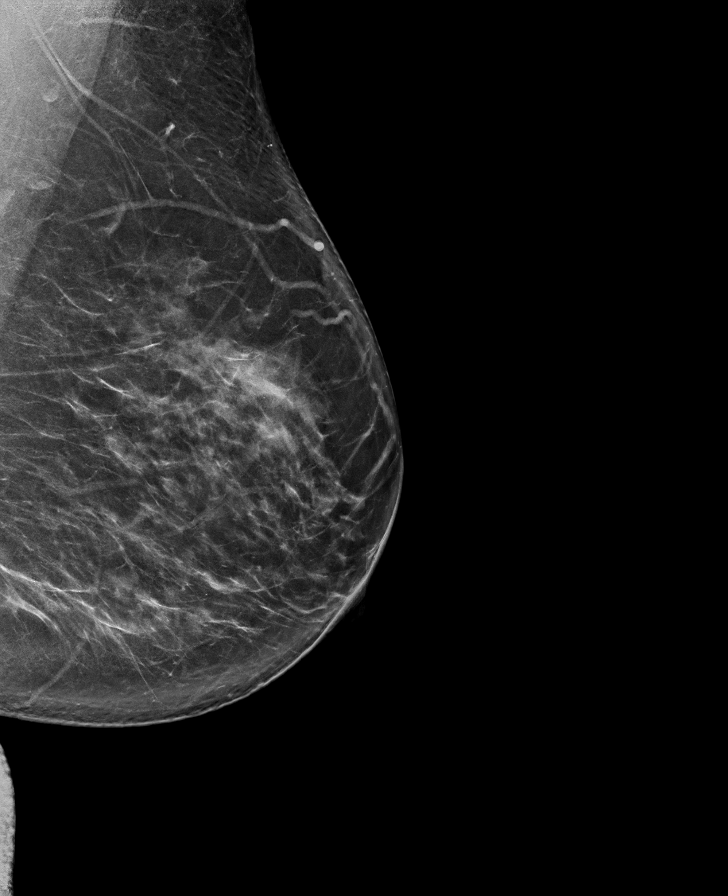

[L MLO tomo · tomo slice 47/94.0]
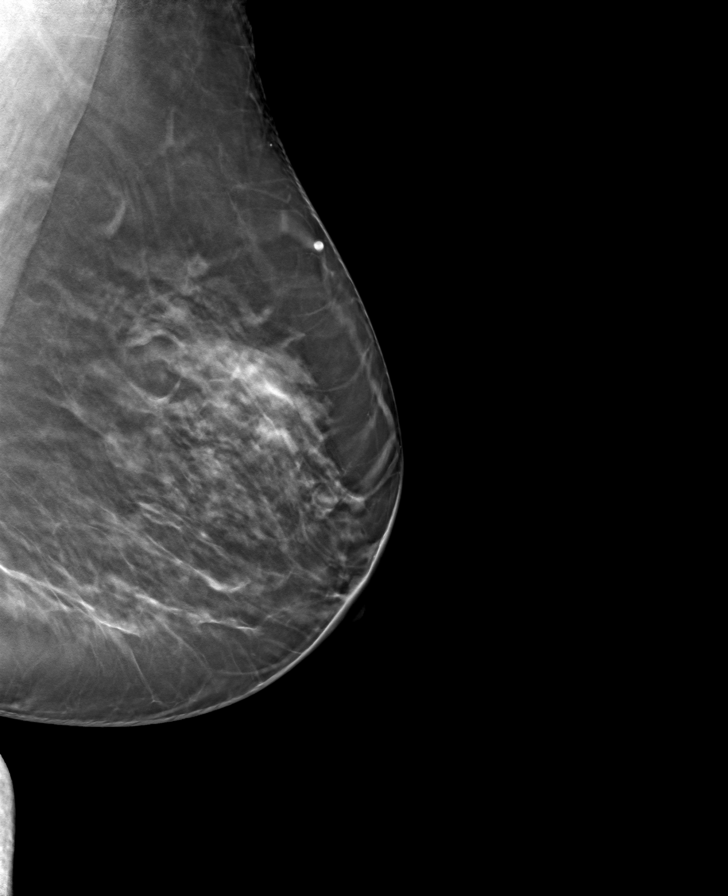

[R CC tomo · tomo slice 39/78.0]
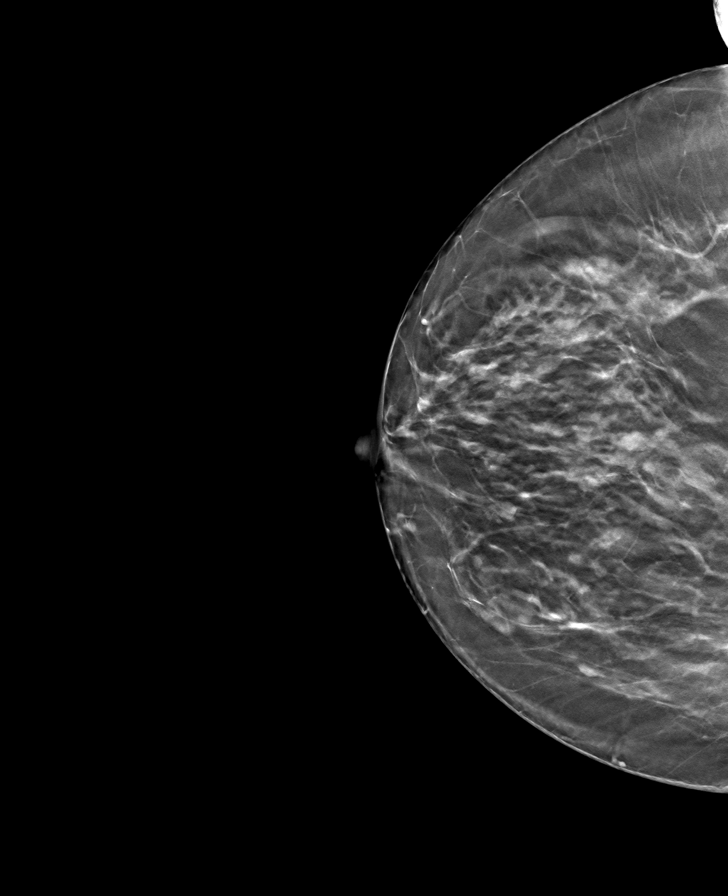

[L CC tomo · tomo slice 39/78.0]
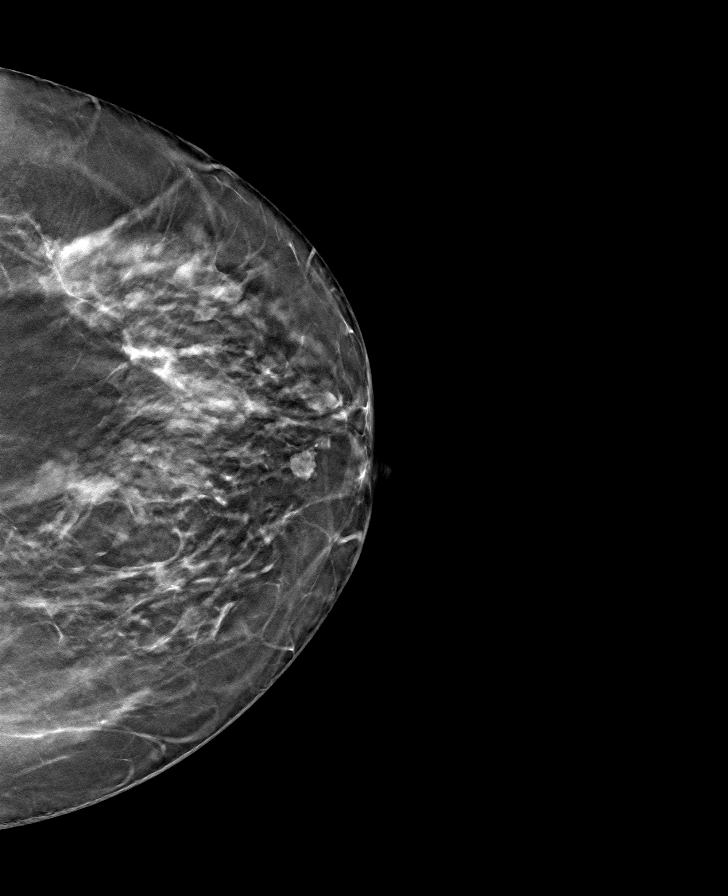

[R MLO tomo · tomo slice 45/90.0]
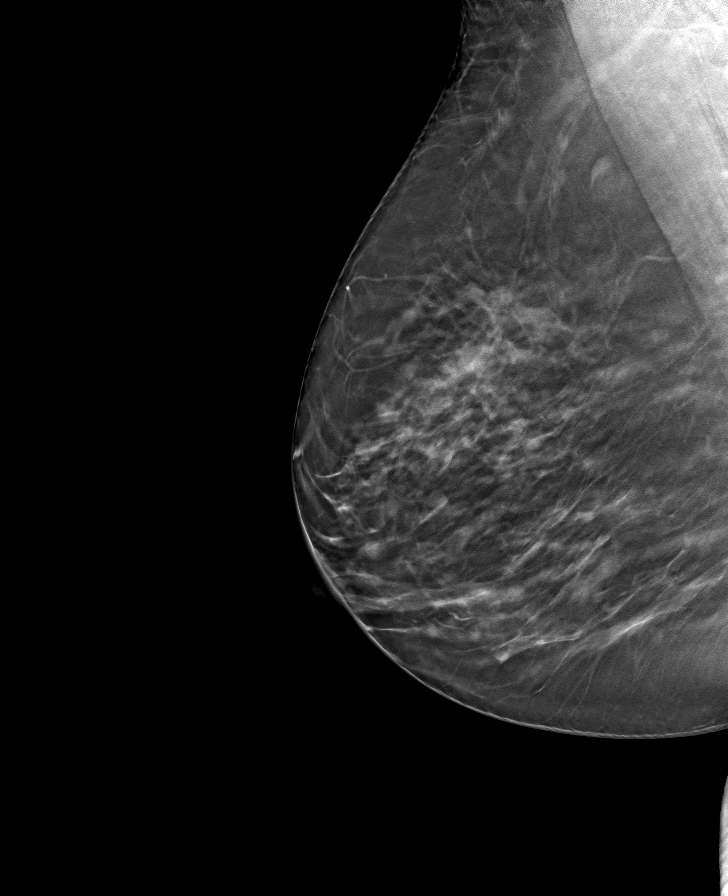

[8 of 24 positions shown; findings below may reference images not displayed]

ACR Breast Density Category c: The breast tissue is heterogeneously
dense, which may obscure small masses.
FINDINGS: There are no findings suspicious for malignancy. Images were
processed with CAD.
IMPRESSION: No mammographic evidence of malignancy. A result letter of this
screening mammogram will be mailed directly to the patient.

RECOMMENDATION:
Screening mammogram in one year. (Code:[id number]-[id number])

BI-RADS CATEGORY  1: Negative.

## 2021-07-07 ENCOUNTER — Ambulatory Visit
Admission: RE | Admit: 2021-07-07 | Discharge: 2021-07-07 | Disposition: A | Discharge status: Home / Self Care | Payer: 59 | Source: Ambulatory Visit | Attending: Family Medicine | Admitting: Family Medicine

## 2021-07-07 DIAGNOSIS — Z1231 Encounter for screening mammogram for malignant neoplasm of breast: Secondary | ICD-10-CM | POA: Diagnosis not present

## 2021-07-28 ENCOUNTER — Other Ambulatory Visit: Payer: Self-pay | Admitting: Family Medicine

## 2021-09-02 ENCOUNTER — Ambulatory Visit: Payer: BC Managed Care – PPO | Admitting: Family Medicine

## 2021-09-04 ENCOUNTER — Other Ambulatory Visit: Payer: Self-pay | Admitting: Family Medicine

## 2021-09-04 DIAGNOSIS — F419 Anxiety disorder, unspecified: Secondary | ICD-10-CM

## 2021-09-05 ENCOUNTER — Other Ambulatory Visit: Payer: Self-pay | Admitting: Family Medicine

## 2021-09-23 ENCOUNTER — Telehealth: Payer: Self-pay | Admitting: Physician Assistant

## 2021-09-23 ENCOUNTER — Ambulatory Visit (INDEPENDENT_AMBULATORY_CARE_PROVIDER_SITE_OTHER): Payer: Medicare HMO | Admitting: Physician Assistant

## 2021-09-23 ENCOUNTER — Encounter: Payer: Self-pay | Admitting: Physician Assistant

## 2021-09-23 VITALS — BP 137/91 | HR 92 | Temp 98.1°F | Resp 16 | Wt 230.0 lb

## 2021-09-23 DIAGNOSIS — J029 Acute pharyngitis, unspecified: Secondary | ICD-10-CM

## 2021-09-23 DIAGNOSIS — J069 Acute upper respiratory infection, unspecified: Secondary | ICD-10-CM

## 2021-09-23 LAB — POCT RAPID STREP A (OFFICE): Rapid Strep A Screen: NEGATIVE

## 2021-09-23 MED ORDER — AZITHROMYCIN 250 MG PO TABS: ORAL_TABLET | ORAL | 0 refills | Status: AC

## 2021-09-23 NOTE — Telephone Encounter (Signed)
Please, let pt know that I placed an urgent order for her to [facility] for a proper evaluation.

## 2021-09-23 NOTE — Progress Notes (Signed)
I,Roshena L Chambers,acting as a Neurosurgeon for [employer], PA-C.,have documented all relevant documentation on the behalf of Debera Lat, PA-C,as directed by  [employer], PA-C while in the presence of [employer], PA-C.   Established patient visit   Patient: Monica Hoffman   DOB: [birth year]   66 y.o. Female  MRN: [id number] Visit Date: 09/23/2021  Today's healthcare provider: Debera Lat, PA-C   Chief Complaint  Patient presents with   Cough   Subjective    Cough This is a new problem. Episode onset: 3 days ago. The problem has been gradually worsening. The cough is Productive of sputum (light green sputum). Associated symptoms include chills, ear pain (left ear), headaches, rhinorrhea, a sore throat and shortness of breath. Pertinent negatives include no chest pain, fever, hemoptysis or myalgias. Treatments tried: salt water gargles, increasing fluid intake and Tylenol. The treatment provided mild relief.  Patient tested negative for COVID 2 days ago. She reports her husband was sick last week with with similar symptoms and was diagnosed with a sinus infection.   Medications: Outpatient Medications Prior to Visit  Medication Sig   aspirin 81 MG tablet    clonazePAM (KLONOPIN) 0.5 MG tablet TAKE ONE-HALF TO ONE TABLET BY MOUTH TWICE DAILY AS NEEDED FOR ANXIETY   lisinopril (ZESTRIL) 5 MG tablet TAKE 1 TABLET BY MOUTH DAILY   meloxicam (MOBIC) 7.5 MG tablet TAKE ONE TABLET BY MOUTH EVERY 12 HOURS   rosuvastatin (CRESTOR) 5 MG tablet Take 1 tablet (5 mg total) by mouth daily.   verapamil (CALAN-SR) 120 MG CR tablet TAKE ONE TABLET BY MOUTH DAILY   verapamil (CALAN-SR) 240 MG CR tablet TAKE ONE TABLET BY MOUTH AT BEDTIME   No facility-administered medications prior to visit.    Review of Systems  Constitutional:  Positive for chills, diaphoresis and fatigue. Negative for appetite change and fever.  HENT:  Positive for congestion, ear pain (left ear), rhinorrhea,  sneezing and sore throat.   Respiratory:  Positive for cough and shortness of breath. Negative for hemoptysis and chest tightness.   Cardiovascular:  Negative for chest pain and palpitations.  Gastrointestinal:  Negative for abdominal pain, diarrhea, nausea and vomiting.  Musculoskeletal:  Negative for myalgias.  Neurological:  Positive for headaches. Negative for dizziness and weakness.       Objective    BP (!) 137/91 (BP Location: Left Arm, Patient Position: Sitting, Cuff Size: Large)   Pulse 92   Temp 98.1 F (36.7 C) (Oral)   Resp 16   Wt 230 lb (104.3 kg)   SpO2 98% Comment: room air  BMI 33.97 kg/m    Physical Exam Vitals reviewed.  Constitutional:      General: She is not in acute distress.    Appearance: Normal appearance. She is well-developed. She is not diaphoretic.  HENT:     Head: Normocephalic and atraumatic.     Right Ear: Tympanic membrane, ear canal and external ear normal.     Left Ear: There is impacted cerumen.     Nose: Congestion and rhinorrhea present.     Mouth/Throat:     Mouth: Mucous membranes are moist.     Pharynx: Posterior oropharyngeal erythema present. No oropharyngeal exudate.  Eyes:     General: No scleral icterus.       Right eye: Discharge (watery) present.        Left eye: Discharge (watery) present.    Extraocular Movements: Extraocular movements intact.  Conjunctiva/sclera: Conjunctivae normal.     Pupils: Pupils are equal, round, and reactive to light.  Neck:     Thyroid: No thyromegaly.  Cardiovascular:     Rate and Rhythm: Normal rate and regular rhythm.     Pulses: Normal pulses.     Heart sounds: Normal heart sounds. No murmur heard. Pulmonary:     Effort: Pulmonary effort is normal. No respiratory distress.     Breath sounds: Normal breath sounds. No wheezing, rhonchi or rales.  Musculoskeletal:     Cervical back: Neck supple.     Right lower leg: No edema.     Left lower leg: No edema.  Lymphadenopathy:      Cervical: No cervical adenopathy.  Skin:    General: Skin is warm and dry.     Findings: No rash.  Neurological:     Mental Status: She is alert and oriented to person, place, and time. Mental status is at baseline.  Psychiatric:        Mood and Affect: Mood normal.        Behavior: Behavior normal.      Results for orders placed or performed in visit on 09/23/21  POCT rapid strep A  Result Value Ref Range   Rapid Strep A Screen Negative Negative    Assessment & Plan     1. URI, acute Continue symptomatic treatment However, if symptoms persist or worsen start antibiotic:  - azithromycin (ZITHROMAX) 250 MG tablet; Take 2 tablets on day 1, then 1 tablet daily on days 2 through 5  Dispense: 6 tablet; Refill: 0  Patient was instructed not to fill it unless Her symptoms worsen   Discussed the potential negative side effects of antibiotics and that they can lead to resistance if used unnecessarily Discussed expectations for duration of symptoms. Discussed that they may feel bad for up to a week, but sometimes symptoms last longer (especially if you smoke)  Explained that you can get multiple viruses in a row and recommend prevention strategies such as hand washing  2. Sore throat  - POCT rapid strep A neg   FU if needed  The patient was advised to call back or seek an in-person evaluation if the symptoms worsen or if the condition fails to improve as anticipated.  I discussed the assessment and treatment plan with the patient. The patient was provided an opportunity to ask questions and all were answered. The patient agreed with the plan and demonstrated an understanding of the instructions.  The entirety of the information documented in the History of Present Illness, Review of Systems and Physical Exam were personally obtained by me. Portions of this information were initially documented by the CMA and reviewed by me for thoroughness and accuracy.  Portions of this note were  created using dictation software and may contain typographical errors.     Debera Lat, PA-C  [facility] [phone] (phone) [phone] (fax)  [facility]

## 2021-09-23 NOTE — Telephone Encounter (Signed)
Discard message Per Debera Lat, PA-C.

## 2021-10-03 NOTE — Progress Notes (Signed)
I,Sulibeya S Dimas,acting as a Education administrator for Gwyneth Sprout, FNP.,have documented all relevant documentation on the behalf of Gwyneth Sprout, FNP,as directed by  Gwyneth Sprout, FNP while in the presence of Gwyneth Sprout, FNP.   Established patient visit   Patient: Monica Hoffman   DOB: May 05, 1955   66 y.o. Female  MRN: [id number] Visit Date: 10/13/2021  Today's healthcare provider: Gwyneth Sprout, FNP  Patient presents for new patient visit to establish care.  Introduced to Designer, jewellery role and practice setting.  All questions answered.  Discussed provider/patient relationship and expectations.   Chief Complaint  Patient presents with   Diabetes   Hypertension   Hyperlipidemia   Subjective    HPI  Diabetes Mellitus Type II, follow-up  Lab Results  Component Value Date   HGBA1C 7.4 (A) 10/13/2021   HGBA1C 6.8 (H) 03/04/2021   HGBA1C 6.3 (A) 08/05/2020   Last seen for diabetes 7 months ago.  Management since then includes continuing the same treatment. She reports no oral medications. She is not having side effects.   Home blood sugar records:  not being checked  Episodes of hypoglycemia? No    Current insulin regiment: none Most Recent Eye Exam:   --------------------------------------------------------------------------------------------------- Hypertension, follow-up  BP Readings from Last 3 Encounters:  10/13/21 117/81  09/23/21 (!) 137/91  03/04/21 118/86   Wt Readings from Last 3 Encounters:  10/13/21 226 lb 4.8 oz (102.6 kg)  09/23/21 230 lb (104.3 kg)  03/04/21 224 lb (101.6 kg)     She was last seen for hypertension 8 months ago.  BP at that visit was 118/86. Management since that visit includes continue lisinopril. She reports excellent compliance with treatment. She is not having side effects.  Outside blood pressures are not being checed.  She does not smoke.  Use of agents associated with hypertension: none.    --------------------------------------------------------------------------------------------------- Lipid/Cholesterol, follow-up  Last Lipid Panel: Lab Results  Component Value Date   CHOL 264 (H) 03/04/2021   LDLCALC 177 (H) 03/04/2021   HDL 46 03/04/2021   TRIG 216 (H) 03/04/2021    She was last seen for this 8 months ago.  Management since that visit includes continue crestor.  She reports excellent compliance with treatment. She is having side effects. Cramps in hands  Symptoms: No appetite changes No foot ulcerations  No chest pain No chest pressure/discomfort  No dyspnea No orthopnea  No fatigue No lower extremity edema  No palpitations No paroxysmal nocturnal dyspnea  No nausea No numbness or tingling of extremity  No polydipsia No polyuria  No speech difficulty No syncope    Last metabolic panel Lab Results  Component Value Date   GLUCOSE 139 (H) 03/04/2021   NA 144 03/04/2021   K 5.0 03/04/2021   BUN 15 03/04/2021   CREATININE 1.11 (H) 03/04/2021   EGFR 56 (L) 03/04/2021   GFRNONAA 67 01/30/2020   CALCIUM 10.0 03/04/2021   AST 16 03/04/2021   ALT 16 03/04/2021   The 10-year ASCVD risk score (Arnett DK, et al., 2019) is: 14.5%  ---------------------------------------------------------------------------------------------------   Medications: Outpatient Medications Prior to Visit  Medication Sig   aspirin 81 MG tablet    clonazePAM (KLONOPIN) 0.5 MG tablet TAKE ONE-HALF TO ONE TABLET BY MOUTH TWICE DAILY AS NEEDED FOR ANXIETY   lisinopril (ZESTRIL) 5 MG tablet TAKE 1 TABLET BY MOUTH DAILY   meloxicam (MOBIC) 7.5 MG tablet Take 1 tablet by mouth every 12 (twelve)  hours.   rosuvastatin (CRESTOR) 5 MG tablet Take 1 tablet (5 mg total) by mouth daily.   verapamil (CALAN-SR) 120 MG CR tablet TAKE ONE TABLET BY MOUTH DAILY   verapamil (CALAN-SR) 240 MG CR tablet TAKE ONE TABLET BY MOUTH AT BEDTIME   [DISCONTINUED] meloxicam (MOBIC) 7.5 MG tablet TAKE ONE  TABLET BY MOUTH EVERY 12 HOURS   No facility-administered medications prior to visit.    Review of Systems  Constitutional:  Negative for appetite change, chills and fatigue.  HENT:  Positive for ear pain and hearing loss.   Respiratory:  Negative for shortness of breath.   Cardiovascular:  Negative for chest pain, palpitations and leg swelling.  Gastrointestinal:  Negative for abdominal pain, nausea and vomiting.        Objective    BP 117/81 (BP Location: Left Arm, Patient Position: Sitting, Cuff Size: Large)   Pulse 88   Temp 98.5 F (36.9 C) (Oral)   Resp 16   Wt 226 lb 4.8 oz (102.6 kg)   BMI 33.42 kg/m  BP Readings from Last 3 Encounters:  10/13/21 117/81  09/23/21 (!) 137/91  03/04/21 118/86   Wt Readings from Last 3 Encounters:  10/13/21 226 lb 4.8 oz (102.6 kg)  09/23/21 230 lb (104.3 kg)  03/04/21 224 lb (101.6 kg)      Physical Exam Vitals and nursing note reviewed.  Constitutional:      General: She is not in acute distress.    Appearance: Normal appearance. She is obese. She is not ill-appearing, toxic-appearing or diaphoretic.  HENT:     Head: Normocephalic and atraumatic.     Right Ear: Decreased hearing noted. Tenderness present. A middle ear effusion is present. There is impacted cerumen. Tympanic membrane is erythematous.     Left Ear: Decreased hearing noted. A middle ear effusion is present. There is impacted cerumen.  Cardiovascular:     Rate and Rhythm: Normal rate and regular rhythm.     Pulses: Normal pulses.     Heart sounds: Normal heart sounds. No murmur heard.    No friction rub. No gallop.  Pulmonary:     Effort: Pulmonary effort is normal. No respiratory distress.     Breath sounds: Normal breath sounds. No stridor. No wheezing, rhonchi or rales.  Chest:     Chest wall: No tenderness.  Abdominal:     General: Bowel sounds are normal.     Palpations: Abdomen is soft.  Musculoskeletal:        General: No swelling, tenderness,  deformity or signs of injury. Normal range of motion.     Right lower leg: No edema.     Left lower leg: No edema.  Skin:    General: Skin is warm and dry.     Capillary Refill: Capillary refill takes less than 2 seconds.     Coloration: Skin is not jaundiced or pale.     Findings: No bruising, erythema, lesion or rash.  Neurological:     General: No focal deficit present.     Mental Status: She is alert and oriented to person, place, and time. Mental status is at baseline.     Cranial Nerves: No cranial nerve deficit.     Sensory: No sensory deficit.     Motor: No weakness.     Coordination: Coordination normal.  Psychiatric:        Mood and Affect: Mood normal.        Behavior: Behavior normal.  Thought Content: Thought content normal.        Judgment: Judgment normal.      Results for orders placed or performed in visit on 10/13/21  POCT glycosylated hemoglobin (Hb A1C)  Result Value Ref Range   Hemoglobin A1C 7.4 (A) 4.0 - 5.6 %   Est. average glucose Bld gHb Est-mCnc 166     Assessment & Plan     Problem List Items Addressed This Visit       Cardiovascular and Mediastinum   Hypertension associated with diabetes ([place])    Chronic, stable; <130/<80 Denies CP Denies SOB/ DOE Denies low blood pressure/hypotension Denies vision changes No LE Edema noted on exam Continue medication, Lisinopril 5 mg, Verapamil 360 mg Seek emergent care if you develop chest pain or chest pressure       Relevant Orders   Comprehensive metabolic panel     Digestive   Dry mouth and eyes    Chronic, worsening from recent URI Encourage additional labs for Sjorgens Referral placed to ENT      Relevant Orders   Ambulatory referral to ENT   ANA Direct w/Reflex if Positive   Sjogrens syndrome-A extractable nuclear antibody   Sjogrens syndrome-B extractable nuclear antibody   CBC with Differential/Platelet     Endocrine   Hyperlipidemia associated with type 2 diabetes  mellitus ([place])    Chronic, previously elevated On Crestor at 5 mg Complaints of hand cramping; however, pt is a massage therapist Resolved with use of topical NSAIDs Denies change in medication at this time Due for FLP      Relevant Orders   Lipid panel   T2DM (type 2 diabetes mellitus) ([facility])    Chronic, A1c reveals increase Now back in DM range; declines to start on Metformin at this time Continue to recommend balanced, lower carb meals. Smaller meal size, adding snacks. Choosing water as drink of choice and increasing purposeful exercise. Previously controlled with diet/exercise alone Recommend 3 month f/u      Relevant Orders   POCT glycosylated hemoglobin (Hb A1C) (Completed)     Other   Elevated serum creatinine    Chronic, repeat CMP given previous elevation BP remains stable       Relevant Orders   Comprehensive metabolic panel   RESOLVED: Need for pneumococcal 20-valent conjugate vaccination   Otalgia of both ears - Primary    S/p URI with Abx treatment Reports dental complaints with concern for misaligned smile s/p extraction with bridge that is causing ear fullness/pain Will refer to ENT Cleaned in office d/t excess wax       Relevant Medications   ciprofloxacin-dexamethasone (CIPRODEX) OTIC suspension   Other Relevant Orders   Ambulatory referral to ENT   ANA Direct w/Reflex if Positive   Sjogrens syndrome-A extractable nuclear antibody   Sjogrens syndrome-B extractable nuclear antibody   CBC with Differential/Platelet     Return in about 3 months (around 01/13/2022) for chonic disease management.      Vonna Kotyk, FNP, have reviewed all documentation for this visit. The documentation on 10/13/21 for the exam, diagnosis, procedures, and orders are all accurate and complete.    Gwyneth Sprout, [place] [phone] (phone) [phone] (fax)  [place]

## 2021-10-13 ENCOUNTER — Ambulatory Visit: Payer: BC Managed Care – PPO | Admitting: Family Medicine

## 2021-10-13 ENCOUNTER — Ambulatory Visit (INDEPENDENT_AMBULATORY_CARE_PROVIDER_SITE_OTHER): Payer: Medicare HMO | Admitting: Family Medicine

## 2021-10-13 ENCOUNTER — Encounter: Payer: Self-pay | Admitting: Family Medicine

## 2021-10-13 VITALS — BP 117/81 | HR 88 | Temp 98.5°F | Resp 16 | Wt 226.3 lb

## 2021-10-13 DIAGNOSIS — R682 Dry mouth, unspecified: Secondary | ICD-10-CM | POA: Diagnosis not present

## 2021-10-13 DIAGNOSIS — H04123 Dry eye syndrome of bilateral lacrimal glands: Secondary | ICD-10-CM

## 2021-10-13 DIAGNOSIS — H9203 Otalgia, bilateral: Secondary | ICD-10-CM

## 2021-10-13 DIAGNOSIS — E1169 Type 2 diabetes mellitus with other specified complication: Secondary | ICD-10-CM | POA: Diagnosis not present

## 2021-10-13 DIAGNOSIS — R7989 Other specified abnormal findings of blood chemistry: Secondary | ICD-10-CM

## 2021-10-13 DIAGNOSIS — I152 Hypertension secondary to endocrine disorders: Secondary | ICD-10-CM | POA: Diagnosis not present

## 2021-10-13 DIAGNOSIS — E785 Hyperlipidemia, unspecified: Secondary | ICD-10-CM | POA: Diagnosis not present

## 2021-10-13 DIAGNOSIS — E1159 Type 2 diabetes mellitus with other circulatory complications: Secondary | ICD-10-CM | POA: Diagnosis not present

## 2021-10-13 DIAGNOSIS — Z23 Encounter for immunization: Secondary | ICD-10-CM | POA: Diagnosis not present

## 2021-10-13 LAB — POCT GLYCOSYLATED HEMOGLOBIN (HGB A1C)
Est. average glucose Bld gHb Est-mCnc: 166
Hemoglobin A1C: 7.4 % — AB (ref 4.0–5.6)

## 2021-10-13 MED ORDER — CIPROFLOXACIN-DEXAMETHASONE 0.3-0.1 % OT SUSP: 4.0000 [drp] | Freq: Two times a day (BID) | OTIC | 0 refills | Status: DC

## 2021-10-13 NOTE — Assessment & Plan Note (Signed)
Chronic, repeat CMP given previous elevation BP remains stable

## 2021-10-13 NOTE — Assessment & Plan Note (Addendum)
Chronic, A1c reveals increase Now back in DM range; declines to start on Metformin at this time Continue to recommend balanced, lower carb meals. Smaller meal size, adding snacks. Choosing water as drink of choice and increasing purposeful exercise. Previously controlled with diet/exercise alone Recommend 3 month f/u

## 2021-10-13 NOTE — Assessment & Plan Note (Signed)
S/p URI with Abx treatment Reports dental complaints with concern for misaligned smile s/p extraction with bridge that is causing ear fullness/pain Will refer to ENT Cleaned in office d/t excess wax

## 2021-10-13 NOTE — Assessment & Plan Note (Signed)
Chronic, stable; <130/<80 Denies CP Denies SOB/ DOE Denies low blood pressure/hypotension Denies vision changes No LE Edema noted on exam Continue medication, Lisinopril 5 mg, Verapamil 360 mg Seek emergent care if you develop chest pain or chest pressure

## 2021-10-13 NOTE — Assessment & Plan Note (Signed)
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

## 2021-10-13 NOTE — Assessment & Plan Note (Signed)
Chronic, worsening from recent URI Encourage additional labs for Sjorgens Referral placed to ENT

## 2021-10-13 NOTE — Assessment & Plan Note (Deleted)
PNA 20 provided; consented  VIS provided

## 2021-11-24 DIAGNOSIS — M9903 Segmental and somatic dysfunction of lumbar region: Secondary | ICD-10-CM | POA: Diagnosis not present

## 2021-11-24 DIAGNOSIS — M5451 Vertebrogenic low back pain: Secondary | ICD-10-CM | POA: Diagnosis not present

## 2021-11-25 DIAGNOSIS — H9203 Otalgia, bilateral: Secondary | ICD-10-CM | POA: Diagnosis not present

## 2021-11-25 DIAGNOSIS — E1169 Type 2 diabetes mellitus with other specified complication: Secondary | ICD-10-CM | POA: Diagnosis not present

## 2021-11-25 DIAGNOSIS — R682 Dry mouth, unspecified: Secondary | ICD-10-CM | POA: Diagnosis not present

## 2021-11-25 DIAGNOSIS — E785 Hyperlipidemia, unspecified: Secondary | ICD-10-CM | POA: Diagnosis not present

## 2021-11-25 DIAGNOSIS — H04123 Dry eye syndrome of bilateral lacrimal glands: Secondary | ICD-10-CM | POA: Diagnosis not present

## 2021-11-26 ENCOUNTER — Encounter: Payer: Self-pay | Admitting: Family Medicine

## 2021-11-26 DIAGNOSIS — M5416 Radiculopathy, lumbar region: Secondary | ICD-10-CM | POA: Diagnosis not present

## 2021-11-26 DIAGNOSIS — M47896 Other spondylosis, lumbar region: Secondary | ICD-10-CM | POA: Diagnosis not present

## 2021-11-26 LAB — COMPREHENSIVE METABOLIC PANEL
ALT: 11 IU/L (ref 0–32)
AST: 13 IU/L (ref 0–40)
Albumin/Globulin Ratio: 2.1 (ref 1.2–2.2)
Albumin: 4.6 g/dL (ref 3.9–4.9)
Alkaline Phosphatase: 70 IU/L (ref 44–121)
BUN/Creatinine Ratio: 11 — ABNORMAL LOW (ref 12–28)
BUN: 11 mg/dL (ref 8–27)
Bilirubin Total: 0.3 mg/dL (ref 0.0–1.2)
CO2: 22 mmol/L (ref 20–29)
Calcium: 9.8 mg/dL (ref 8.7–10.3)
Chloride: 105 mmol/L (ref 96–106)
Creatinine, Ser: 0.99 mg/dL (ref 0.57–1.00)
Globulin, Total: 2.2 g/dL (ref 1.5–4.5)
Glucose: 147 mg/dL — ABNORMAL HIGH (ref 70–99)
Potassium: 4.9 mmol/L (ref 3.5–5.2)
Sodium: 141 mmol/L (ref 134–144)
Total Protein: 6.8 g/dL (ref 6.0–8.5)
eGFR: 63 mL/min/{1.73_m2} (ref >59)

## 2021-11-26 LAB — CBC WITH DIFFERENTIAL/PLATELET
Basophils Absolute: 0 10*3/uL (ref 0.0–0.2)
Basos: 0 %
EOS (ABSOLUTE): 0.2 10*3/uL (ref 0.0–0.4)
Eos: 3 %
Hematocrit: 43.9 % (ref 34.0–46.6)
Hemoglobin: 14 g/dL (ref 11.1–15.9)
Immature Grans (Abs): 0 10*3/uL (ref 0.0–0.1)
Immature Granulocytes: 0 %
Lymphocytes Absolute: 2.3 10*3/uL (ref 0.7–3.1)
Lymphs: 35 %
MCH: 27.7 pg (ref 26.6–33.0)
MCHC: 31.9 g/dL (ref 31.5–35.7)
MCV: 87 fL (ref 79–97)
Monocytes Absolute: 0.5 10*3/uL (ref 0.1–0.9)
Monocytes: 7 %
Neutrophils Absolute: 3.7 10*3/uL (ref 1.4–7.0)
Neutrophils: 55 %
Platelets: 201 10*3/uL (ref 150–450)
RBC: 5.05 x10E6/uL (ref 3.77–5.28)
RDW: 13.1 % (ref 11.7–15.4)
WBC: 6.7 10*3/uL (ref 3.4–10.8)

## 2021-11-26 LAB — LIPID PANEL
Chol/HDL Ratio: 3.3 ratio (ref 0.0–4.4)
Cholesterol, Total: 180 mg/dL (ref 100–199)
HDL: 55 mg/dL (ref >39)
LDL Chol Calc (NIH): 102 mg/dL — ABNORMAL HIGH (ref 0–99)
Triglycerides: 129 mg/dL (ref 0–149)
VLDL Cholesterol Cal: 23 mg/dL (ref 5–40)

## 2021-11-26 LAB — ANA W/REFLEX IF POSITIVE: Anti Nuclear Antibody (ANA): NEGATIVE

## 2021-11-26 LAB — SJOGRENS SYNDROME-B EXTRACTABLE NUCLEAR ANTIBODY: ENA SSB (LA) Ab: 0.7 AI (ref 0.0–0.9)

## 2021-11-26 LAB — SJOGRENS SYNDROME-A EXTRACTABLE NUCLEAR ANTIBODY: ENA SSA (RO) Ab: <0.2 AI (ref 0.0–0.9)

## 2021-11-26 NOTE — Progress Notes (Signed)
Blood chemistry stable- elevated blood sugar remains present.  Cholesterol is improved. Total is at goal <200; LDL/bad cholesterol remains slightly above goal for patients who have diabetes at 102. Goal of 55-70.  The 10-year ASCVD risk score (Arnett DK, et al., 2019) is: 11.2%   Values used to calculate the score:     Age: 66 years     Sex: Female     Is Non-Hispanic African American: No     Diabetic: Yes     Tobacco smoker: No     Systolic Blood Pressure: 117 mmHg     Is BP treated: Yes     HDL Cholesterol: 55 mg/dL     Total Cholesterol: 180 mg/dL  Heart attack and stroke risk is 11% estimated within the next 10 years which is moderate/high. I recommend diet low in saturated fat and regular exercise - 30 min at least 5 times per week  Normal cell count.  Other labs are pending at this time.  Please let us know if you have any questions.  Thank you, Jacky Kindle, FNP  [facility] [address] [place], [place] [address] [phone] (phone) [phone] (fax) [facility]

## 2021-12-06 ENCOUNTER — Other Ambulatory Visit: Payer: Self-pay | Admitting: Family Medicine

## 2021-12-10 DIAGNOSIS — E119 Type 2 diabetes mellitus without complications: Secondary | ICD-10-CM | POA: Diagnosis not present

## 2021-12-10 LAB — HM DIABETES EYE EXAM

## 2021-12-19 DIAGNOSIS — M5416 Radiculopathy, lumbar region: Secondary | ICD-10-CM | POA: Diagnosis not present

## 2021-12-24 ENCOUNTER — Other Ambulatory Visit: Payer: Self-pay | Admitting: Family Medicine

## 2021-12-24 MED ORDER — VERAPAMIL HCL ER 240 MG PO TBCR: 240.0000 mg | EXTENDED_RELEASE_TABLET | Freq: Every day | ORAL | 1 refills | Status: DC

## 2022-01-05 DIAGNOSIS — M5416 Radiculopathy, lumbar region: Secondary | ICD-10-CM | POA: Diagnosis not present

## 2022-01-15 DIAGNOSIS — M5416 Radiculopathy, lumbar region: Secondary | ICD-10-CM | POA: Diagnosis not present

## 2022-01-22 DIAGNOSIS — M5416 Radiculopathy, lumbar region: Secondary | ICD-10-CM | POA: Diagnosis not present

## 2022-02-03 DIAGNOSIS — M5416 Radiculopathy, lumbar region: Secondary | ICD-10-CM | POA: Diagnosis not present

## 2022-02-09 DIAGNOSIS — M5416 Radiculopathy, lumbar region: Secondary | ICD-10-CM | POA: Diagnosis not present

## 2022-02-14 ENCOUNTER — Other Ambulatory Visit: Payer: Self-pay | Admitting: Family Medicine

## 2022-02-20 DIAGNOSIS — M5416 Radiculopathy, lumbar region: Secondary | ICD-10-CM | POA: Diagnosis not present

## 2022-03-06 NOTE — Progress Notes (Unsigned)
I,Supreme Rybarczyk S Lorelie Biermann,acting as a Education administrator for Lavon Paganini, MD.,have documented all relevant documentation on the behalf of Lavon Paganini, MD,as directed by  Lavon Paganini, MD while in the presence of Lavon Paganini, MD.    Welcome to Medicare     Patient: Monica Hoffman, Female    DOB: 03/21/1956, 66 y.o.   MRN: [id number] Visit Date: 03/09/2022  Today's Provider: Lavon Paganini, MD   Chief Complaint  Patient presents with   welcome to [place] is a 66 y.o. female who presents today for her Annual Wellness Visit. She reports consuming a general diet. Home exercise routine includes walking 30 minutes daily. She generally feels well. She reports sleeping well. She does not have additional problems to discuss today.   HPI Patient declined Prevnar vaccine today.  She reports she will get this done at [facility].    Medications: Outpatient Medications Prior to Visit  Medication Sig   aspirin 81 MG tablet    ciprofloxacin-dexamethasone (CIPRODEX) OTIC suspension Place 4 drops into both ears 2 (two) times daily.   clonazePAM (KLONOPIN) 0.5 MG tablet TAKE ONE-HALF TO ONE TABLET BY MOUTH TWICE DAILY AS NEEDED FOR ANXIETY   lisinopril (ZESTRIL) 5 MG tablet TAKE 1 TABLET BY MOUTH DAILY   meloxicam (MOBIC) 7.5 MG tablet TAKE ONE TABLET BY MOUTH EVERY 12 HOURS   rosuvastatin (CRESTOR) 5 MG tablet TAKE ONE TABLET BY MOUTH DAILY   verapamil (CALAN-SR) 120 MG CR tablet TAKE ONE TABLET BY MOUTH DAILY   verapamil (CALAN-SR) 240 MG CR tablet TAKE ONE TABLET BY MOUTH AT BEDTIME   verapamil (CALAN-SR) 240 MG CR tablet Take 1 tablet (240 mg total) by mouth at bedtime.   No facility-administered medications prior to visit.    No Known Allergies  Patient Care Team: Virginia Crews, MD as PCP - General (Family Medicine)  Review of Systems  Constitutional:  Positive for appetite change.  HENT:  Positive for ear pain.   Musculoskeletal:   Positive for back pain and myalgias.  Neurological:  Positive for numbness.  All other systems reviewed and are negative.   Last CBC Lab Results  Component Value Date   WBC 6.7 11/25/2021   HGB 14.0 11/25/2021   HCT 43.9 11/25/2021   MCV 87 11/25/2021   MCH 27.7 11/25/2021   RDW 13.1 11/25/2021   PLT 201 35/57/3220   Last metabolic panel Lab Results  Component Value Date   GLUCOSE 147 (H) 11/25/2021   NA 141 11/25/2021   K 4.9 11/25/2021   CL 105 11/25/2021   CO2 22 11/25/2021   BUN 11 11/25/2021   CREATININE 0.99 11/25/2021   EGFR 63 11/25/2021   CALCIUM 9.8 11/25/2021   PROT 6.8 11/25/2021   ALBUMIN 4.6 11/25/2021   LABGLOB 2.2 11/25/2021   AGRATIO 2.1 11/25/2021   BILITOT 0.3 11/25/2021   ALKPHOS 70 11/25/2021   AST 13 11/25/2021   ALT 11 11/25/2021   Last lipids Lab Results  Component Value Date   CHOL 180 11/25/2021   HDL 55 11/25/2021   LDLCALC 102 (H) 11/25/2021   TRIG 129 11/25/2021   CHOLHDL 3.3 11/25/2021   Last hemoglobin A1c Lab Results  Component Value Date   HGBA1C 7.4 (A) 10/13/2021   Last thyroid functions Lab Results  Component Value Date   TSH 1.820 10/23/2019   T4TOTAL 7.4 10/23/2019        Objective    Vitals: BP 123/82 (  BP Location: Left Arm, Patient Position: Sitting, Cuff Size: Large)   Pulse 77   Temp 98.7 F (37.1 C) (Oral)   Resp 16   Ht _0  (1.753 m)   Wt 222 lb 14.4 oz (101.1 kg)   BMI 32.92 kg/m  BP Readings from Last 3 Encounters:  03/09/22 123/82  10/13/21 117/81  09/23/21 (!) 137/91   Wt Readings from Last 3 Encounters:  03/09/22 222 lb 14.4 oz (101.1 kg)  10/13/21 226 lb 4.8 oz (102.6 kg)  09/23/21 230 lb (104.3 kg)   Physical Exam Vitals reviewed.  Constitutional:      General: She is not in acute distress.    Appearance: Normal appearance. She is well-developed. She is not diaphoretic.  HENT:     Head: Normocephalic and atraumatic.     Right Ear: Tympanic membrane, ear canal and external ear  normal.     Left Ear: Tympanic membrane, ear canal and external ear normal.     Nose: Nose normal.     Mouth/Throat:     Mouth: Mucous membranes are moist.     Pharynx: Oropharynx is clear. No oropharyngeal exudate.  Eyes:     General: No scleral icterus.    Conjunctiva/sclera: Conjunctivae normal.     Pupils: Pupils are equal, round, and reactive to light.  Neck:     Thyroid: No thyromegaly.  Cardiovascular:     Rate and Rhythm: Normal rate and regular rhythm.     Pulses: Normal pulses.     Heart sounds: Normal heart sounds. No murmur heard. Pulmonary:     Effort: Pulmonary effort is normal. No respiratory distress.     Breath sounds: Normal breath sounds. No wheezing or rales.  Abdominal:     General: There is no distension.     Palpations: Abdomen is soft.     Tenderness: There is no abdominal tenderness.  Musculoskeletal:        General: No deformity.     Cervical back: Neck supple.     Right lower leg: No edema.     Left lower leg: No edema.  Lymphadenopathy:     Cervical: No cervical adenopathy.  Skin:    General: Skin is warm and dry.     Findings: No rash.  Neurological:     Mental Status: She is alert and oriented to person, place, and time. Mental status is at baseline.     Gait: Gait normal.  Psychiatric:        Mood and Affect: Mood normal.        Behavior: Behavior normal.        Thought Content: Thought content normal.     Most recent functional status assessment:    03/09/2022    2:12 PM  In your present state of health, do you have any difficulty performing the following activities:  Hearing? 0  Vision? 0  Difficulty concentrating or making decisions? 0  Walking or climbing stairs? 1  Dressing or bathing? 0  Doing errands, shopping? 0   Most recent fall risk assessment:    03/09/2022    1:31 PM  Fall Risk   Falls in the past year? 0  Number falls in past yr: 0  Injury with Fall? 0  Risk for fall due to : No Fall Risks  Follow up Falls  evaluation completed    Most recent depression screenings:    03/09/2022    2:12 PM 10/13/2021    8:56 AM  PHQ 2/9  Scores  PHQ - 2 Score 0 0  PHQ- 9 Score 1 0   Most recent cognitive screening:     No data to display         Most recent Audit-C alcohol use screening    03/09/2022    2:12 PM  Alcohol Use Disorder Test (AUDIT)  1. How often do you have a drink containing alcohol? 1  2. How many drinks containing alcohol do you have on a typical day when you are drinking? 0  3. How often do you have six or more drinks on one occasion? 0  AUDIT-C Score 1   A score of 3 or more in women, and 4 or more in men indicates increased risk for alcohol abuse, EXCEPT if all of the points are from question 1   No results found for any visits on 03/09/22.  Assessment & Plan     Annual wellness visit done today including the all of the following: Reviewed patient's Family Medical History Reviewed and updated list of patient's medical providers Assessment of cognitive impairment was done Assessed patient's functional ability Established a written schedule for health screening [place] Completed and Reviewed  Exercise Activities and Dietary recommendations  Goals   None     Immunization History  Administered Date(s) Administered   Influenza Split 03/13/2011, 12/18/2011   Influenza,inj,Quad PF,6+ Mos 01/30/2013, 04/23/2014, 01/06/2016, 02/05/2017, 02/26/2018, 01/09/2019   Influenza-Unspecified 01/22/2020, 02/03/2021, 02/25/2022   PFIZER(Purple Top)SARS-COV-2 Vaccination 06/30/2019, 07/25/2019, 02/16/2020   Pneumococcal Polysaccharide-23 08/05/2020   Td 01/09/2019   Tdap 10/13/2006   Zoster Recombinat (Shingrix) 08/29/2021    Health Maintenance  Topic Date Due   Pneumonia Vaccine 10+ Years old (2 - PCV) 09/07/2021   DEXA SCAN  Never done   Zoster Vaccines- Shingrix (2 of 2) 10/24/2021   COVID-19 Vaccine (4 - 2023-24 season) 12/05/2021   Diabetic kidney  evaluation - Urine ACR  03/04/2022   HEMOGLOBIN A1C  04/15/2022   Diabetic kidney evaluation - GFR measurement  11/26/2022   OPHTHALMOLOGY EXAM  12/11/2022   FOOT EXAM  03/10/2023   Medicare Annual Wellness (AWV)  03/10/2023   MAMMOGRAM  07/08/2023   PAP SMEAR-Modifier  01/09/2024   COLONOSCOPY (Pts 45-72yr Insurance coverage will need to be confirmed)  03/04/2027   DTaP/Tdap/Td (3 - Td or Tdap) 01/08/2029   INFLUENZA VACCINE  Completed   Hepatitis C Screening  Completed   HIV Screening  Completed   HPV VACCINES  Aged Out     Discussed health benefits of physical activity, and encouraged her to engage in regular exercise appropriate for her age and condition.    Problem List Items Addressed This Visit   None Visit Diagnoses     Welcome to Medicare preventive visit    -  Primary   Relevant Orders   EKG 12-Lead   DG Bone Density   Screening for osteoporosis       Relevant Orders   DG Bone Density        Return in about 4 weeks (around 04/06/2022) for chronic disease f/u.     I, ALavon Paganini MD, have reviewed all documentation for this visit. The documentation on 03/09/22 for the exam, diagnosis, procedures, and orders are all accurate and complete.   Bac[place]alupo, ADionne Bucy MD, MPH [facility]

## 2022-03-09 ENCOUNTER — Encounter: Payer: Self-pay | Admitting: Family Medicine

## 2022-03-09 ENCOUNTER — Ambulatory Visit (INDEPENDENT_AMBULATORY_CARE_PROVIDER_SITE_OTHER): Payer: Medicare HMO | Admitting: Family Medicine

## 2022-03-09 VITALS — BP 123/82 | HR 77 | Temp 98.7°F | Resp 16 | Ht 69.0 in | Wt 222.9 lb

## 2022-03-09 DIAGNOSIS — Z23 Encounter for immunization: Secondary | ICD-10-CM

## 2022-03-09 DIAGNOSIS — Z0000 Encounter for general adult medical examination without abnormal findings: Secondary | ICD-10-CM

## 2022-03-09 DIAGNOSIS — Z13820 Encounter for screening for osteoporosis: Secondary | ICD-10-CM | POA: Diagnosis not present

## 2022-03-19 ENCOUNTER — Other Ambulatory Visit: Payer: Self-pay | Admitting: Family Medicine

## 2022-03-19 NOTE — Telephone Encounter (Signed)
Future visit in 1 month. Requested Prescriptions  Pending Prescriptions Disp Refills   lisinopril (ZESTRIL) 5 MG tablet [Pharmacy Med Name: LISINOPRIL 5 MG TAB] 90 tablet 0    Sig: TAKE 1 TABLET BY MOUTH DAILY     Cardiovascular:  ACE Inhibitors Passed - 03/19/2022 12:56 PM      Passed - Cr in normal range and within 180 days    Creat  Date Value Ref Range Status  02/08/2017 0.97 0.50 - 0.99 mg/dL Final    Comment:    For patients >65 years of age, the reference limit for Creatinine is approximately 13% higher for people identified as African-American. .    Creatinine, Ser  Date Value Ref Range Status  11/25/2021 0.99 0.57 - 1.00 mg/dL Final         Passed - K in normal range and within 180 days    Potassium  Date Value Ref Range Status  11/25/2021 4.9 3.5 - 5.2 mmol/L Final         Passed - Patient is not pregnant      Passed - Last BP in normal range    BP Readings from Last 1 Encounters:  03/09/22 123/82         Passed - Valid encounter within last 6 months    Recent Outpatient Visits           1 week ago Welcome to [employer] preventive visit   [facility], Marzella Schlein, MD   5 months ago Otalgia of both ears   [facility] Merita Norton T, FNP   5 months ago URI, acute   [facility], [place], PA-C   1 year ago Encounter for annual physical exam   [facility], Marzella Schlein, MD   1 year ago Type 2 diabetes mellitus without complication, without long-term current use of insulin [facility])   [facility] Bacigalupo, Marzella Schlein, MD       Future Appointments             In 1 month Bacigalupo, Marzella Schlein, MD [facility]

## 2022-03-24 ENCOUNTER — Other Ambulatory Visit: Payer: Self-pay | Admitting: Family Medicine

## 2022-03-24 NOTE — Telephone Encounter (Signed)
Unable to refill per protocol, request is too soon. Last refill 12/24/21 for 90 and 1 refill. Will refuse.  Requested Prescriptions  Pending Prescriptions Disp Refills   verapamil (CALAN-SR) 120 MG CR tablet [Pharmacy Med Name: VERAPAMIL HCL ER 120 MG TAB] 90 tablet 1    Sig: TAKE ONE TABLET BY MOUTH DAILY     Cardiovascular: Calcium Channel Blockers 3 Passed - 03/24/2022 10:23 AM      Passed - ALT in normal range and within 360 days    ALT  Date Value Ref Range Status  11/25/2021 11 0 - 32 IU/L Final         Passed - AST in normal range and within 360 days    AST  Date Value Ref Range Status  11/25/2021 13 0 - 40 IU/L Final         Passed - Cr in normal range and within 360 days    Creat  Date Value Ref Range Status  02/08/2017 0.97 0.50 - 0.99 mg/dL Final    Comment:    For patients >13 years of age, the reference limit for Creatinine is approximately 13% higher for people identified as African-American. .    Creatinine, Ser  Date Value Ref Range Status  11/25/2021 0.99 0.57 - 1.00 mg/dL Final         Passed - Last BP in normal range    BP Readings from Last 1 Encounters:  03/09/22 123/82         Passed - Last Heart Rate in normal range    Pulse Readings from Last 1 Encounters:  03/09/22 77         Passed - Valid encounter within last 6 months    Recent Outpatient Visits           2 weeks ago Welcome to [employer] preventive visit   [facility], Marzella Schlein, MD   5 months ago Otalgia of both ears   [facility] Merita Norton T, FNP   6 months ago URI, acute   [employer], [place], PA-C   1 year ago Encounter for annual physical exam   [facility] [place], Marzella Schlein, MD   1 year ago Type 2 diabetes mellitus without complication, without long-term current use of insulin [facility])   [facility], Marzella Schlein, MD       Future Appointments             In 1 month  Bacigalupo, Marzella Schlein, MD [facility], [facility]

## 2022-04-17 ENCOUNTER — Other Ambulatory Visit: Payer: Self-pay | Admitting: Family Medicine

## 2022-04-17 NOTE — Telephone Encounter (Signed)
90 day courtesy refill Requested Prescriptions  Pending Prescriptions Disp Refills   verapamil (CALAN-SR) 120 MG CR tablet [Pharmacy Med Name: VERAPAMIL HCL ER 120 MG TAB] 90 tablet 0    Sig: TAKE ONE TABLET BY MOUTH DAILY     Cardiovascular: Calcium Channel Blockers 3 Failed - 04/17/2022  1:30 PM      Failed - Valid encounter within last 6 months    Recent Outpatient Visits           1 month ago Welcome to [employer] preventive visit   [employer] [facility], Dionne Bucy, MD   6 months ago Otalgia of both [place] Tally Joe T, FNP   6 months ago URI, acute   [employer], [place], PA-C   1 year ago Encounter for annual physical exam   [employer], Dionne Bucy, MD   1 year ago Type 2 diabetes mellitus without complication, without long-term current use of insulin ([place])   [facility], Dionne Bucy, MD       Future Appointments             In 6 days Bacigalupo, Dionne Bucy, MD [facility], [facility]            Passed - ALT in normal range and within 360 days    ALT  Date Value Ref Range Status  11/25/2021 11 0 - 32 IU/L Final         Passed - AST in normal range and within 360 days    AST  Date Value Ref Range Status  11/25/2021 13 0 - 40 IU/L Final         Passed - Cr in normal range and within 360 days    Creat  Date Value Ref Range Status  02/08/2017 0.97 0.50 - 0.99 mg/dL Final    Comment:    For patients >72 years of age, the reference limit for Creatinine is approximately 13% higher for people identified as African-American. .    Creatinine, Ser  Date Value Ref Range Status  11/25/2021 0.99 0.57 - 1.00 mg/dL Final         Passed - Last BP in normal range    BP Readings from Last 1 Encounters:  03/09/22 123/82         Passed - Last Heart Rate in normal range    Pulse Readings from Last 1 Encounters:  03/09/22 77

## 2022-04-22 NOTE — Progress Notes (Deleted)
Established patient visit   Patient: Monica Hoffman   DOB: [birth year]   67 y.o. Female  MRN: [id number] Visit Date: 04/23/2022  Today's healthcare provider: Lavon Paganini, MD   No chief complaint on file.  Subjective    HPI  Diabetes Mellitus Type II, follow-up  Lab Results  Component Value Date   HGBA1C 7.4 (A) 10/13/2021   HGBA1C 6.8 (H) 03/04/2021   HGBA1C 6.3 (A) 08/05/2020   Last seen for diabetes 6 months ago.  Management since then includes no changes. No oral medication. Patient declined to start on Metformin. Home blood sugar records:  not being checked   Current insulin regiment: none Most Recent Eye Exam: UTD  --------------------------------------------------------------------------------------------------- Hypertension, follow-up  BP Readings from Last 3 Encounters:  03/09/22 123/82  10/13/21 117/81  09/23/21 (!) 137/91   Wt Readings from Last 3 Encounters:  03/09/22 222 lb 14.4 oz (101.1 kg)  10/13/21 226 lb 4.8 oz (102.6 kg)  09/23/21 230 lb (104.3 kg)     She was last seen for hypertension 6 months ago.  BP at that visit was 117/81. Management since that visit includes no changes. She reports {excellent/good/fair/poor:19665} compliance with treatment.  Outside blood pressures are {enter patient reported home BP, or 'not being checked':1}.  --------------------------------------------------------------------------------------------------- Lipid/Cholesterol, follow-up  Last Lipid Panel: Lab Results  Component Value Date   CHOL 180 11/25/2021   LDLCALC 102 (H) 11/25/2021   HDL 55 11/25/2021   TRIG 129 11/25/2021    She was last seen for this 6 months ago.  Management since that visit includes no changes.  She reports {excellent/good/fair/poor:19665} compliance with treatment. She {is/is not:9024} having side effects. {document side effects if [id number]  Last metabolic panel Lab Results  Component Value Date   GLUCOSE 147  (H) 11/25/2021   NA 141 11/25/2021   K 4.9 11/25/2021   BUN 11 11/25/2021   CREATININE 0.99 11/25/2021   EGFR 63 11/25/2021   GFRNONAA 67 01/30/2020   CALCIUM 9.8 11/25/2021   AST 13 11/25/2021   ALT 11 11/25/2021   The 10-year ASCVD risk score (Arnett DK, et al., 2019) is: 12.3%  ---------------------------------------------------------------------------------------------------   Medications: Outpatient Medications Prior to Visit  Medication Sig   aspirin 81 MG tablet    ciprofloxacin-dexamethasone (CIPRODEX) OTIC suspension Place 4 drops into both ears 2 (two) times daily.   clonazePAM (KLONOPIN) 0.5 MG tablet TAKE ONE-HALF TO ONE TABLET BY MOUTH TWICE DAILY AS NEEDED FOR ANXIETY   lisinopril (ZESTRIL) 5 MG tablet TAKE 1 TABLET BY MOUTH DAILY   meloxicam (MOBIC) 7.5 MG tablet TAKE ONE TABLET BY MOUTH EVERY 12 HOURS   rosuvastatin (CRESTOR) 5 MG tablet TAKE ONE TABLET BY MOUTH DAILY   verapamil (CALAN-SR) 120 MG CR tablet TAKE ONE TABLET BY MOUTH DAILY   verapamil (CALAN-SR) 240 MG CR tablet TAKE ONE TABLET BY MOUTH AT BEDTIME   verapamil (CALAN-SR) 240 MG CR tablet Take 1 tablet (240 mg total) by mouth at bedtime.   No facility-administered medications prior to visit.    Review of Systems  Constitutional:  Negative for appetite change and fatigue.  Eyes:  Negative for visual disturbance.  Respiratory:  Negative for chest tightness and shortness of breath.   Cardiovascular:  Negative for chest pain and leg swelling.  Gastrointestinal:  Negative for abdominal pain, nausea and vomiting.  Neurological:  Negative for dizziness, light-headedness and numbness.    {Labs  Heme  Chem  Endocrine  Serology  Results Review (  optional):23779}   Objective    There were no vitals taken for this visit. {Show previous vital signs (optional):23777}  Physical Exam  ***  No results found for any visits on 04/23/22.  Assessment & Plan     ***  No follow-ups on file.       {provider attestation***:1}   Lavon Paganini, MD  [facility] [phone] (phone) [phone] (fax)  [place]

## 2022-04-23 ENCOUNTER — Ambulatory Visit: Payer: Medicare HMO | Admitting: Family Medicine

## 2022-05-13 NOTE — Progress Notes (Unsigned)
I,Hattie Aguinaldo S Davy Westmoreland,acting as a Education administrator for Lavon Paganini, MD.,have documented all relevant documentation on the behalf of Lavon Paganini, MD,as directed by  Lavon Paganini, MD while in the presence of Lavon Paganini, MD.     Established patient visit   Patient: Monica Hoffman   DOB: [birth year]   67 y.o. Female  MRN: [id number] Visit Date: 05/14/2022  Today's healthcare provider: Lavon Paganini, MD   No chief complaint on file.  Subjective    HPI  Diabetes Mellitus Type II, follow-up  Lab Results  Component Value Date   HGBA1C 7.4 (A) 10/13/2021   HGBA1C 6.8 (H) 03/04/2021   HGBA1C 6.3 (A) 08/05/2020   Last seen for diabetes 6 months ago.  Management since then includes recommend metformin. Patient declined to start med. Advised to continue to work on healthy lifestyle changes.   Home blood sugar records: {diabetes glucometry results:16657}  Episodes of hypoglycemia? {Yes/No:20286} {enter details if yes:1}   Current insulin regiment: none Most Recent Eye Exam: UTD  --------------------------------------------------------------------------------------------------- Hypertension, follow-up  BP Readings from Last 3 Encounters:  03/09/22 123/82  10/13/21 117/81  09/23/21 (!) 137/91   Wt Readings from Last 3 Encounters:  03/09/22 222 lb 14.4 oz (101.1 kg)  10/13/21 226 lb 4.8 oz (102.6 kg)  09/23/21 230 lb (104.3 kg)     She was last seen for hypertension 6 months ago.  BP at that visit was 117/81. Management since that visit includes no changes. Continue Lisinopril 5 mg, Verapamil 360 mg daily.  She reports {excellent/good/fair/poor:19665} compliance with treatment. She {is/is not:9024} having side effects. {document side effects if present:1}  Outside blood pressures are {enter patient reported home BP, or 'not being checked':1}.  She does not  smoke.  --------------------------------------------------------------------------------------------------- Lipid/Cholesterol, follow-up  Last Lipid Panel: Lab Results  Component Value Date   CHOL 180 11/25/2021   LDLCALC 102 (H) 11/25/2021   HDL 55 11/25/2021   TRIG 129 11/25/2021    She was last seen for this 6 months ago.  Management since that visit includes continue Crestor 5 mg daily.  She reports {excellent/good/fair/poor:19665} compliance with treatment. She {is/is not:9024} having side effects. {document side effects if IPJASNK:5}  Last metabolic panel Lab Results  Component Value Date   GLUCOSE 147 (H) 11/25/2021   NA 141 11/25/2021   K 4.9 11/25/2021   BUN 11 11/25/2021   CREATININE 0.99 11/25/2021   EGFR 63 11/25/2021   GFRNONAA 67 01/30/2020   CALCIUM 9.8 11/25/2021   AST 13 11/25/2021   ALT 11 11/25/2021   The 10-year ASCVD risk score (Arnett DK, et al., 2019) is: 12.3%  ---------------------------------------------------------------------------------------------------   Medications: Outpatient Medications Prior to Visit  Medication Sig   aspirin 81 MG tablet    ciprofloxacin-dexamethasone (CIPRODEX) OTIC suspension Place 4 drops into both ears 2 (two) times daily.   clonazePAM (KLONOPIN) 0.5 MG tablet TAKE ONE-HALF TO ONE TABLET BY MOUTH TWICE DAILY AS NEEDED FOR ANXIETY   lisinopril (ZESTRIL) 5 MG tablet TAKE 1 TABLET BY MOUTH DAILY   meloxicam (MOBIC) 7.5 MG tablet TAKE ONE TABLET BY MOUTH EVERY 12 HOURS   rosuvastatin (CRESTOR) 5 MG tablet TAKE ONE TABLET BY MOUTH DAILY   verapamil (CALAN-SR) 120 MG CR tablet TAKE ONE TABLET BY MOUTH DAILY   verapamil (CALAN-SR) 240 MG CR tablet TAKE ONE TABLET BY MOUTH AT BEDTIME   verapamil (CALAN-SR) 240 MG CR tablet Take 1 tablet (240 mg total) by mouth at bedtime.   No facility-administered medications prior to  visit.    Review of Systems  Constitutional:  Negative for appetite change.  Eyes:  Negative  for visual disturbance.  Respiratory:  Negative for chest tightness and shortness of breath.   Cardiovascular:  Negative for chest pain and leg swelling.  Gastrointestinal:  Negative for abdominal pain and nausea.  Neurological:  Negative for dizziness, light-headedness and headaches.    {Labs  Heme  Chem  Endocrine  Serology  Results Review (optional):23779}   Objective    There were no vitals taken for this visit. {Show previous vital signs (optional):23777}  Physical Exam  ***  No results found for any visits on 05/14/22.  Assessment & Plan     ***  No follow-ups on file.      {provider attestation***:1}   Lavon Paganini, MD  [facility] [phone] (phone) [phone] (fax)  [place]

## 2022-05-14 ENCOUNTER — Ambulatory Visit (INDEPENDENT_AMBULATORY_CARE_PROVIDER_SITE_OTHER): Payer: Medicare HMO | Admitting: Family Medicine

## 2022-05-14 ENCOUNTER — Encounter: Payer: Self-pay | Admitting: Family Medicine

## 2022-05-14 VITALS — BP 132/82 | HR 87 | Temp 98.0°F | Resp 16 | Wt 214.7 lb

## 2022-05-14 DIAGNOSIS — E1169 Type 2 diabetes mellitus with other specified complication: Secondary | ICD-10-CM

## 2022-05-14 DIAGNOSIS — E785 Hyperlipidemia, unspecified: Secondary | ICD-10-CM | POA: Diagnosis not present

## 2022-05-14 DIAGNOSIS — I152 Hypertension secondary to endocrine disorders: Secondary | ICD-10-CM

## 2022-05-14 DIAGNOSIS — R809 Proteinuria, unspecified: Secondary | ICD-10-CM | POA: Diagnosis not present

## 2022-05-14 DIAGNOSIS — E1129 Type 2 diabetes mellitus with other diabetic kidney complication: Secondary | ICD-10-CM | POA: Diagnosis not present

## 2022-05-14 DIAGNOSIS — E1159 Type 2 diabetes mellitus with other circulatory complications: Secondary | ICD-10-CM | POA: Diagnosis not present

## 2022-05-14 DIAGNOSIS — Z6831 Body mass index (BMI) 31.0-31.9, adult: Secondary | ICD-10-CM | POA: Diagnosis not present

## 2022-05-14 DIAGNOSIS — E669 Obesity, unspecified: Secondary | ICD-10-CM

## 2022-05-14 DIAGNOSIS — F419 Anxiety disorder, unspecified: Secondary | ICD-10-CM | POA: Diagnosis not present

## 2022-05-14 DIAGNOSIS — R69 Illness, unspecified: Secondary | ICD-10-CM | POA: Diagnosis not present

## 2022-05-14 LAB — POCT GLYCOSYLATED HEMOGLOBIN (HGB A1C)
Est. average glucose Bld gHb Est-mCnc: 143
Hemoglobin A1C: 6.6 % — AB (ref 4.0–5.6)

## 2022-05-14 MED ORDER — CLONAZEPAM 0.5 MG PO TABS: ORAL_TABLET | ORAL | 1 refills | Status: DC

## 2022-05-14 MED ORDER — MELOXICAM 7.5 MG PO TABS: 7.5000 mg | ORAL_TABLET | Freq: Two times a day (BID) | ORAL | 5 refills | Status: DC

## 2022-05-14 NOTE — Assessment & Plan Note (Signed)
Previously well controlled Continue statin Repeat FLP and CMP Goal LDL < 70 

## 2022-05-14 NOTE — Assessment & Plan Note (Signed)
Discussed importance of healthy weight management Discussed diet and exercise  

## 2022-05-14 NOTE — Assessment & Plan Note (Signed)
Good improvement in A1c, at goal Assoc with HTN and HLD Continue current medications UTD on vaccines, eye exam, foot exam On ACEi/ARB On Statin Discussed diet and exercise F/u in 6 months

## 2022-05-14 NOTE — Assessment & Plan Note (Addendum)
Well controlled Continue current medications Recheck metabolic panel F/u in 6 months  

## 2022-05-14 NOTE — Assessment & Plan Note (Signed)
Continue lisinopril Recheck UACR

## 2022-05-14 NOTE — Assessment & Plan Note (Signed)
Chronic and stable Continue klonopin sparingly and only prn - will not increase # of pills or strength Have discussed SSRI - patient declined Encourage therapy

## 2022-05-16 LAB — LIPID PANEL WITH LDL/HDL RATIO
Cholesterol, Total: 255 mg/dL — ABNORMAL HIGH (ref 100–199)
HDL: 43 mg/dL (ref >39)
LDL Chol Calc (NIH): 164 mg/dL — ABNORMAL HIGH (ref 0–99)
LDL/HDL Ratio: 3.8 ratio — ABNORMAL HIGH (ref 0.0–3.2)
Triglycerides: 257 mg/dL — ABNORMAL HIGH (ref 0–149)
VLDL Cholesterol Cal: 48 mg/dL — ABNORMAL HIGH (ref 5–40)

## 2022-05-16 LAB — COMPREHENSIVE METABOLIC PANEL
ALT: 15 IU/L (ref 0–32)
AST: 19 IU/L (ref 0–40)
Albumin/Globulin Ratio: 1.9 (ref 1.2–2.2)
Albumin: 4.5 g/dL (ref 3.9–4.9)
Alkaline Phosphatase: 77 IU/L (ref 44–121)
BUN/Creatinine Ratio: 12 (ref 12–28)
BUN: 10 mg/dL (ref 8–27)
Bilirubin Total: 0.4 mg/dL (ref 0.0–1.2)
CO2: 23 mmol/L (ref 20–29)
Calcium: 10 mg/dL (ref 8.7–10.3)
Chloride: 105 mmol/L (ref 96–106)
Creatinine, Ser: 0.85 mg/dL (ref 0.57–1.00)
Globulin, Total: 2.4 g/dL (ref 1.5–4.5)
Glucose: 132 mg/dL — ABNORMAL HIGH (ref 70–99)
Potassium: 4.8 mmol/L (ref 3.5–5.2)
Sodium: 142 mmol/L (ref 134–144)
Total Protein: 6.9 g/dL (ref 6.0–8.5)
eGFR: 76 mL/min/{1.73_m2} (ref >59)

## 2022-05-16 LAB — MICROALBUMIN / CREATININE URINE RATIO
Creatinine, Urine: 96 mg/dL
Microalb/Creat Ratio: 11 mg/g creat (ref 0–29)
Microalbumin, Urine: 10.7 ug/mL

## 2022-05-19 ENCOUNTER — Encounter: Payer: Self-pay | Admitting: Family Medicine

## 2022-05-25 ENCOUNTER — Other Ambulatory Visit: Payer: Self-pay

## 2022-05-25 MED ORDER — ROSUVASTATIN CALCIUM 5 MG PO TABS: 5.0000 mg | ORAL_TABLET | ORAL | 3 refills | Status: DC

## 2022-06-08 ENCOUNTER — Other Ambulatory Visit: Payer: Self-pay | Admitting: Family Medicine

## 2022-06-08 DIAGNOSIS — Z1231 Encounter for screening mammogram for malignant neoplasm of breast: Secondary | ICD-10-CM

## 2022-06-12 MED ORDER — ROSUVASTATIN CALCIUM 5 MG PO TABS: 5.0000 mg | ORAL_TABLET | ORAL | 3 refills | Status: DC

## 2022-07-22 ENCOUNTER — Ambulatory Visit
Admission: RE | Admit: 2022-07-22 | Discharge: 2022-07-22 | Disposition: A | Discharge status: Home / Self Care | Payer: Medicare HMO | Source: Ambulatory Visit | Attending: Family Medicine | Admitting: Family Medicine

## 2022-07-22 DIAGNOSIS — Z13820 Encounter for screening for osteoporosis: Secondary | ICD-10-CM | POA: Diagnosis not present

## 2022-07-22 DIAGNOSIS — Z1231 Encounter for screening mammogram for malignant neoplasm of breast: Secondary | ICD-10-CM

## 2022-07-22 DIAGNOSIS — Z780 Asymptomatic menopausal state: Secondary | ICD-10-CM | POA: Diagnosis not present

## 2022-07-23 ENCOUNTER — Other Ambulatory Visit: Payer: Self-pay | Admitting: Family Medicine

## 2022-07-23 ENCOUNTER — Other Ambulatory Visit: Payer: Self-pay | Admitting: Nurse Practitioner

## 2022-07-23 DIAGNOSIS — N6489 Other specified disorders of breast: Secondary | ICD-10-CM

## 2022-07-23 DIAGNOSIS — R928 Other abnormal and inconclusive findings on diagnostic imaging of breast: Secondary | ICD-10-CM

## 2022-07-29 ENCOUNTER — Ambulatory Visit
Admission: RE | Admit: 2022-07-29 | Discharge: 2022-07-29 | Disposition: A | Discharge status: Home / Self Care | Payer: Medicare HMO | Source: Ambulatory Visit | Attending: Family Medicine | Admitting: Family Medicine

## 2022-07-29 DIAGNOSIS — N6489 Other specified disorders of breast: Secondary | ICD-10-CM

## 2022-07-29 DIAGNOSIS — R92332 Mammographic heterogeneous density, left breast: Secondary | ICD-10-CM | POA: Diagnosis not present

## 2022-07-29 DIAGNOSIS — R928 Other abnormal and inconclusive findings on diagnostic imaging of breast: Secondary | ICD-10-CM | POA: Insufficient documentation

## 2022-07-29 DIAGNOSIS — N6002 Solitary cyst of left breast: Secondary | ICD-10-CM | POA: Diagnosis not present

## 2022-07-30 ENCOUNTER — Telehealth (INDEPENDENT_AMBULATORY_CARE_PROVIDER_SITE_OTHER): Payer: Medicare HMO | Admitting: Family Medicine

## 2022-07-30 DIAGNOSIS — E1169 Type 2 diabetes mellitus with other specified complication: Secondary | ICD-10-CM

## 2022-07-30 DIAGNOSIS — F419 Anxiety disorder, unspecified: Secondary | ICD-10-CM | POA: Diagnosis not present

## 2022-07-30 DIAGNOSIS — N6002 Solitary cyst of left breast: Secondary | ICD-10-CM

## 2022-07-30 DIAGNOSIS — E785 Hyperlipidemia, unspecified: Secondary | ICD-10-CM | POA: Diagnosis not present

## 2022-07-30 MED ORDER — MELOXICAM 7.5 MG PO TABS: 7.5000 mg | ORAL_TABLET | Freq: Two times a day (BID) | ORAL | 5 refills | Status: DC

## 2022-07-30 MED ORDER — CLONAZEPAM 0.5 MG PO TABS
ORAL_TABLET | ORAL | 1 refills | Status: DC
Start: 2022-07-30 — End: 2023-01-27

## 2022-07-30 MED ORDER — ROSUVASTATIN CALCIUM 5 MG PO TABS: 5.0000 mg | ORAL_TABLET | Freq: Every day | ORAL | 3 refills | Status: DC

## 2022-07-30 NOTE — Progress Notes (Signed)
Acute Office Visit  Subjective:     Patient ID: Monica Hoffman, female    DOB: 17-Aug-1955, 67 y.o.   MRN: [id number]  Chief Complaint  Patient presents with   Breast Problem   Patient presents via video visit.  Patient is calling from her home. Provider is calling from [facility].   HPI Patient is in today for follow up from her mammogram.  Mammogram results  Mammogram showed a cyst in her left breast. Her results were explained to her and she was told that she should repeat her screening in 1 year.   History of cysts under her right armpit that limit her right arm mobility for the past several months.  She would like to have those checked as well when she has her next mammogram.   Hyperlipidemia  Patient decided to increase her statin from 5 mg every other day to 5 mg daily. Has not noted any side effects. The frequency increase was her own idea.     Review of Systems  Constitutional:  Negative for chills and fever.  Respiratory:  Negative for shortness of breath.   Cardiovascular:  Negative for chest pain.  Gastrointestinal:  Negative for abdominal pain.  Musculoskeletal:  Negative for myalgias.  Neurological:  Negative for dizziness and headaches.        Objective:    There were no vitals taken for this visit.   Physical Exam Constitutional:      General: She is not in acute distress.    Appearance: Normal appearance.  HENT:     Head: Normocephalic and atraumatic.  Pulmonary:     Effort: Pulmonary effort is normal.  Neurological:     Mental Status: She is alert.  Psychiatric:        Mood and Affect: Mood normal.        Behavior: Behavior normal.     No results found for any visits on 07/30/22.      Assessment & Plan:   Problem List Items Addressed This Visit     Anxiety    Chronic and stable.  Continue current medications.       Relevant Medications   clonazePAM (KLONOPIN) 0.5 MG tablet   Hyperlipidemia  associated with type 2 diabetes mellitus    Chronic and elevated on most recent lipid panel.  Increased rosuvastatin to 5 mg daily.        Relevant Medications   rosuvastatin (CRESTOR) 5 MG tablet   Other Visit Diagnoses     Cyst of left breast    -  Primary     Results of mammogram and follow up in 1 year for screening discussed.   Medication refill Meloxicam refilled at this visit.   Meds ordered this encounter  Medications   clonazePAM (KLONOPIN) 0.5 MG tablet    Sig: TAKE ONE-HALF TO ONE TABLET BY MOUTH TWICE DAILY AS NEEDED FOR ANXIETY    Dispense:  30 tablet    Refill:  1    Do not fill <30 days from last refill   meloxicam (MOBIC) 7.5 MG tablet    Sig: Take 1 tablet (7.5 mg total) by mouth every 12 (twelve) hours.    Dispense:  60 tablet    Refill:  5   rosuvastatin (CRESTOR) 5 MG tablet    Sig: Take 1 tablet (5 mg total) by mouth daily.    Dispense:  90 tablet    Refill:  3    FOR NEXT FILL  No follow-ups on file.  Gilmer Mor, Medical Student   Patient seen along with MS3 student Ezekiel Slocumb. I personally evaluated this patient along with the student, and verified all aspects of the history, physical exam, and medical decision making as documented by the student. I agree with the student's documentation and have made all necessary edits.  Marquavion Venhuizen, Marzella Schlein, MD, MPH [facility]

## 2022-07-30 NOTE — Assessment & Plan Note (Signed)
Chronic and stable Continue current medications 

## 2022-07-30 NOTE — Assessment & Plan Note (Signed)
Chronic and elevated on most recent lipid panel.  Increased rosuvastatin to 5 mg daily.

## 2022-08-11 ENCOUNTER — Other Ambulatory Visit: Payer: Self-pay | Admitting: Family Medicine

## 2022-09-01 ENCOUNTER — Other Ambulatory Visit: Payer: Self-pay | Admitting: Family Medicine

## 2022-09-02 NOTE — Telephone Encounter (Signed)
Requested Prescriptions  Pending Prescriptions Disp Refills   lisinopril (ZESTRIL) 5 MG tablet [Pharmacy Med Name: LISINOPRIL 5 MG TAB] 90 tablet 0    Sig: TAKE 1 TABLET BY MOUTH DAILY     Cardiovascular:  ACE Inhibitors Passed - 09/01/2022  3:49 PM      Passed - Cr in normal range and within 180 days    Creat  Date Value Ref Range Status  02/08/2017 0.97 0.50 - 0.99 mg/dL Final    Comment:    For patients >66 years of age, the reference limit for Creatinine is approximately 13% higher for people identified as African-American. .    Creatinine, Ser  Date Value Ref Range Status  05/14/2022 0.85 0.57 - 1.00 mg/dL Final         Passed - K in normal range and within 180 days    Potassium  Date Value Ref Range Status  05/14/2022 4.8 3.5 - 5.2 mmol/L Final         Passed - Patient is not pregnant      Passed - Last BP in normal range    BP Readings from Last 1 Encounters:  05/14/22 132/82         Passed - Valid encounter within last 6 months    Recent Outpatient Visits           1 month ago Cyst of left breast   [facility] [place], Marzella Schlein, MD   3 months ago Type 2 diabetes mellitus with other specified complication, without long-term current use of insulin [facility])   [place] [facility], Marzella Schlein, MD   5 months ago Welcome to [employer] preventive visit   [facility] [place], Marzella Schlein, MD   10 months ago Otalgia of both ears   [facility] Merita Norton T, FNP   11 months ago URI, acute   [facility] [place], [place], PA-C       Future Appointments             In 2 months Bacigalupo, Marzella Schlein, MD [facility], [facility]

## 2022-09-24 ENCOUNTER — Other Ambulatory Visit: Payer: Self-pay | Admitting: Family Medicine

## 2022-09-24 NOTE — Telephone Encounter (Signed)
Requested medication (s) are due for refill today: for review  Requested medication (s) are on the active medication list: yes    Last refill: 12/24/21  #90  0 refills  Future visit scheduled yes 11/12/22  Notes to clinic:Please review, Verapamil 120 CR also on current med profile.  Requested Prescriptions  Pending Prescriptions Disp Refills   verapamil (CALAN-SR) 240 MG CR tablet [Pharmacy Med Name: VERAPAMIL HCL ER 240 MG TAB] 90 tablet     Sig: TAKE ONE TABLET BY MOUTH AT BEDTIME     Cardiovascular: Calcium Channel Blockers 3 Passed - 09/24/2022  4:41 PM      Passed - ALT in normal range and within 360 days    ALT  Date Value Ref Range Status  05/14/2022 15 0 - 32 IU/L Final         Passed - AST in normal range and within 360 days    AST  Date Value Ref Range Status  05/14/2022 19 0 - 40 IU/L Final         Passed - Cr in normal range and within 360 days    Creat  Date Value Ref Range Status  02/08/2017 0.97 0.50 - 0.99 mg/dL Final    Comment:    For patients >2 years of age, the reference limit for Creatinine is approximately 13% higher for people identified as African-American. .    Creatinine, Ser  Date Value Ref Range Status  05/14/2022 0.85 0.57 - 1.00 mg/dL Final         Passed - Last BP in normal range    BP Readings from Last 1 Encounters:  05/14/22 132/82         Passed - Last Heart Rate in normal range    Pulse Readings from Last 1 Encounters:  05/14/22 87         Passed - Valid encounter within last 6 months    Recent Outpatient Visits           1 month ago Cyst of left breast   [place] [facility] [place], Marzella Schlein, MD   4 months ago Type 2 diabetes mellitus with other specified complication, without long-term current use of insulin [facility])   [facility] [place], Marzella Schlein, MD   6 months ago Welcome to [employer] preventive visit   [facility] [place], Marzella Schlein, MD    11 months ago Otalgia of both ears   [facility] Merita Norton T, FNP   1 year ago URI, acute   [place] [facility] [place], [place], PA-C       Future Appointments             In 1 month Bacigalupo, Marzella Schlein, MD [facility], [facility]

## 2022-11-12 ENCOUNTER — Ambulatory Visit: Payer: Medicare HMO | Admitting: Family Medicine

## 2022-11-27 ENCOUNTER — Encounter: Payer: Self-pay | Admitting: Family Medicine

## 2022-11-27 ENCOUNTER — Ambulatory Visit: Payer: Medicare HMO | Admitting: Family Medicine

## 2022-11-27 VITALS — BP 130/80 | HR 98 | Temp 99.0°F | Resp 16 | Ht 69.0 in | Wt 211.7 lb

## 2022-11-27 DIAGNOSIS — R809 Proteinuria, unspecified: Secondary | ICD-10-CM

## 2022-11-27 DIAGNOSIS — E1169 Type 2 diabetes mellitus with other specified complication: Secondary | ICD-10-CM

## 2022-11-27 DIAGNOSIS — Z23 Encounter for immunization: Secondary | ICD-10-CM | POA: Diagnosis not present

## 2022-11-27 DIAGNOSIS — E1129 Type 2 diabetes mellitus with other diabetic kidney complication: Secondary | ICD-10-CM

## 2022-11-27 DIAGNOSIS — R1031 Right lower quadrant pain: Secondary | ICD-10-CM | POA: Diagnosis not present

## 2022-11-27 DIAGNOSIS — K625 Hemorrhage of anus and rectum: Secondary | ICD-10-CM

## 2022-11-27 DIAGNOSIS — E1159 Type 2 diabetes mellitus with other circulatory complications: Secondary | ICD-10-CM

## 2022-11-27 DIAGNOSIS — Z1211 Encounter for screening for malignant neoplasm of colon: Secondary | ICD-10-CM | POA: Diagnosis not present

## 2022-11-27 DIAGNOSIS — F419 Anxiety disorder, unspecified: Secondary | ICD-10-CM | POA: Diagnosis not present

## 2022-11-27 DIAGNOSIS — I152 Hypertension secondary to endocrine disorders: Secondary | ICD-10-CM | POA: Diagnosis not present

## 2022-11-27 DIAGNOSIS — E785 Hyperlipidemia, unspecified: Secondary | ICD-10-CM

## 2022-11-27 NOTE — Assessment & Plan Note (Signed)
Previously well controlled Continue statin Repeat FLP and CMP Goal LDL < 70 

## 2022-11-27 NOTE — Assessment & Plan Note (Signed)
Continue lisinopril Recheck UACR annually

## 2022-11-27 NOTE — Assessment & Plan Note (Signed)
Elevated blood pressure at visit, patient did not take morning dose of Verapamil. -Continue Verapamil 360mg  daily (120mg  and 240mg  taken together in the morning). -Recheck blood pressure before patient leaves office.

## 2022-11-27 NOTE — Assessment & Plan Note (Signed)
Chronic and stable Continue klonopin sparingly and only prn - will not increase # of pills or strength Have discussed SSRI - patient declined Encourage therapy

## 2022-11-27 NOTE — Assessment & Plan Note (Signed)
Follow-up for A1c check. -Order A1c, lipid panel, liver function tests, and kidney function tests.

## 2022-11-27 NOTE — Progress Notes (Signed)
Established Patient Office Visit  Subjective   Patient ID: Monica Hoffman, female    DOB: 03-24-1956  Age: 67 y.o. MRN: [id number]  Chief Complaint  Patient presents with   Medical Management of Chronic Issues    HPI  Discussed the use of AI scribe software for clinical note transcription with the patient, who gave verbal consent to proceed.  History of Present Illness   The patient, with a history of hypertension and diabetes, presents for a follow-up visit. She reports an episode of rectal bleeding that occurred a couple of months ago, which she believes may have been due to a burst polyp. The bleeding has not recurred since. The patient also reports ongoing right-sided abdominal pain, describing a feeling of fullness and loss of balance. She denies any changes in bowel habits and reports regular bowel movements. The patient has been under significant stress due to her husband's health issues, including dementia and recent hospitalization for pneumonia and sepsis. The patient has been managing her stress with clonazepam, taken twice daily. She also reports not taking her hypertension medication on the day of the visit.        ROS    Objective:     BP 130/80 (BP Location: Left Arm, Patient Position: Sitting, Cuff Size: Large)   Pulse 98   Temp 99 F (37.2 C)   Resp 16   Ht 5\' 9"  (1.753 m)   Wt 211 lb 11.2 oz (96 kg)   SpO2 98%   BMI 31.26 kg/m    Physical Exam Vitals reviewed.  Constitutional:      General: She is not in acute distress.    Appearance: Normal appearance. She is well-developed. She is not diaphoretic.  HENT:     Head: Normocephalic and atraumatic.  Eyes:     General: No scleral icterus.    Conjunctiva/sclera: Conjunctivae normal.  Neck:     Thyroid: No thyromegaly.  Cardiovascular:     Rate and Rhythm: Normal rate and regular rhythm.     Pulses: Normal pulses.     Heart sounds: Normal heart sounds. No murmur heard. Pulmonary:     Effort:  Pulmonary effort is normal. No respiratory distress.     Breath sounds: Normal breath sounds. No wheezing, rhonchi or rales.  Musculoskeletal:     Cervical back: Neck supple.     Right lower leg: No edema.     Left lower leg: No edema.  Lymphadenopathy:     Cervical: No cervical adenopathy.  Skin:    General: Skin is warm and dry.     Findings: No rash.  Neurological:     Mental Status: She is alert and oriented to person, place, and time. Mental status is at baseline.  Psychiatric:        Mood and Affect: Mood is anxious. Affect is tearful.        Behavior: Behavior normal.      No results found for any visits on 11/27/22.    The 10-year ASCVD risk score (Arnett DK, et al., 2019) is: 19.3%    Assessment & Plan:   Problem List Items Addressed This Visit       Cardiovascular and Mediastinum   Hypertension associated with diabetes (HCC) - Primary    Elevated blood pressure at visit, patient did not take morning dose of Verapamil. -Continue Verapamil 360mg  daily (120mg  and 240mg  taken together in the morning). -Recheck blood pressure before patient leaves office.      Relevant  Orders   Comprehensive metabolic panel     Endocrine   Hyperlipidemia associated with type 2 diabetes mellitus (HCC)    Previously well controlled Continue statin Repeat FLP and CMP Goal LDL < 70       Relevant Orders   Comprehensive metabolic panel   Lipid panel   T2DM (type 2 diabetes mellitus) (HCC)    Follow-up for A1c check. -Order A1c, lipid panel, liver function tests, and kidney function tests.      Relevant Orders   Hemoglobin A1c   Microalbuminuria due to type 2 diabetes mellitus (HCC)    Continue lisinopril Recheck UACR annually        Other   Anxiety    Chronic and stable Continue klonopin sparingly and only prn - will not increase # of pills or strength Have discussed SSRI - patient declined Encourage therapy      Other Visit Diagnoses     Colon cancer  screening       Relevant Orders   Ambulatory referral to Gastroenterology   Right lower quadrant abdominal pain       Relevant Orders   CT ABDOMEN PELVIS W CONTRAST   CBC w/Diff/Platelet   Rectal bleeding       Relevant Orders   CT ABDOMEN PELVIS W CONTRAST   CBC w/Diff/Platelet   Need for pneumococcal 20-valent conjugate vaccination       Relevant Orders   Pneumococcal conjugate vaccine 20-valent (Prevnar 20) (Completed)           Rectal Bleeding Single episode of bright red blood per rectum 2 months ago, no recurrence. Right lower quadrant abdominal pain, no change in bowel habits. Possible diverticular bleed. -Order CT scan of abdomen and pelvis with contrast to further evaluate. -Refer to Dr. Tobi Bastos at [facility] for colonoscopy for further evaluation and routine colon cancer screening.  General Health Maintenance -Administer Pneumococcal vaccine today. -Schedule annual wellness visit for December 2024.        Return in about 3 months (around 02/27/2023) for CPE, AWV after 03/10/23.    Shirlee Latch, MD

## 2022-11-28 LAB — COMPREHENSIVE METABOLIC PANEL
ALT: 14 IU/L (ref 0–32)
AST: 16 IU/L (ref 0–40)
Albumin: 4.5 g/dL (ref 3.9–4.9)
Alkaline Phosphatase: 71 IU/L (ref 44–121)
BUN/Creatinine Ratio: 12 (ref 12–28)
BUN: 12 mg/dL (ref 8–27)
Bilirubin Total: 0.3 mg/dL (ref 0.0–1.2)
CO2: 22 mmol/L (ref 20–29)
Calcium: 9.8 mg/dL (ref 8.7–10.3)
Chloride: 107 mmol/L — ABNORMAL HIGH (ref 96–106)
Creatinine, Ser: 0.97 mg/dL (ref 0.57–1.00)
Globulin, Total: 2.3 g/dL (ref 1.5–4.5)
Glucose: 113 mg/dL — ABNORMAL HIGH (ref 70–99)
Potassium: 4.9 mmol/L (ref 3.5–5.2)
Sodium: 143 mmol/L (ref 134–144)
Total Protein: 6.8 g/dL (ref 6.0–8.5)
eGFR: 64 mL/min/{1.73_m2} (ref >59)

## 2022-11-28 LAB — LIPID PANEL
Chol/HDL Ratio: 3 ratio (ref 0.0–4.4)
Cholesterol, Total: 156 mg/dL (ref 100–199)
HDL: 52 mg/dL (ref >39)
LDL Chol Calc (NIH): 82 mg/dL (ref 0–99)
Triglycerides: 125 mg/dL (ref 0–149)
VLDL Cholesterol Cal: 22 mg/dL (ref 5–40)

## 2022-11-28 LAB — HEMOGLOBIN A1C
Est. average glucose Bld gHb Est-mCnc: 146 mg/dL
Hgb A1c MFr Bld: 6.7 % — ABNORMAL HIGH (ref 4.8–5.6)

## 2022-11-30 ENCOUNTER — Telehealth: Payer: Self-pay

## 2022-11-30 NOTE — Telephone Encounter (Signed)
Patient called back to let Monica Hoffman know that her doctor wanted a CT scan done first.

## 2022-12-01 ENCOUNTER — Other Ambulatory Visit: Payer: Self-pay | Admitting: Family Medicine

## 2022-12-08 ENCOUNTER — Ambulatory Visit
Admission: RE | Admit: 2022-12-08 | Discharge: 2022-12-08 | Disposition: A | Discharge status: Home / Self Care | Payer: Medicare HMO | Source: Ambulatory Visit | Attending: Family Medicine | Admitting: Family Medicine

## 2022-12-08 DIAGNOSIS — K5730 Diverticulosis of large intestine without perforation or abscess without bleeding: Secondary | ICD-10-CM | POA: Diagnosis not present

## 2022-12-08 DIAGNOSIS — R932 Abnormal findings on diagnostic imaging of liver and biliary tract: Secondary | ICD-10-CM | POA: Diagnosis not present

## 2022-12-08 DIAGNOSIS — K625 Hemorrhage of anus and rectum: Secondary | ICD-10-CM | POA: Insufficient documentation

## 2022-12-08 DIAGNOSIS — R1031 Right lower quadrant pain: Secondary | ICD-10-CM | POA: Insufficient documentation

## 2022-12-08 MED ORDER — IOHEXOL 300 MG/ML  SOLN
100.0000 mL | Freq: Once | INTRAMUSCULAR | Status: AC | PRN
  Administered 2022-12-08: 100 mL via INTRAVENOUS

## 2022-12-16 ENCOUNTER — Encounter: Payer: Self-pay | Admitting: *Deleted

## 2022-12-23 ENCOUNTER — Other Ambulatory Visit: Payer: Self-pay | Admitting: Family Medicine

## 2022-12-24 NOTE — Telephone Encounter (Signed)
Requested Prescriptions  Pending Prescriptions Disp Refills   verapamil (CALAN-SR) 120 MG CR tablet [Pharmacy Med Name: VERAPAMIL HCL ER 120 MG TAB] 90 tablet 0    Sig: TAKE ONE TABLET BY MOUTH DAILY     Cardiovascular: Calcium Channel Blockers 3 Passed - 12/23/2022  4:51 PM      Passed - ALT in normal range and within 360 days    ALT  Date Value Ref Range Status  11/27/2022 14 0 - 32 IU/L Final         Passed - AST in normal range and within 360 days    AST  Date Value Ref Range Status  11/27/2022 16 0 - 40 IU/L Final         Passed - Cr in normal range and within 360 days    Creat  Date Value Ref Range Status  02/08/2017 0.97 0.50 - 0.99 mg/dL Final    Comment:    For patients >47 years of age, the reference limit for Creatinine is approximately 13% higher for people identified as African-American. .    Creatinine, Ser  Date Value Ref Range Status  11/27/2022 0.97 0.57 - 1.00 mg/dL Final         Passed - Last BP in normal range    BP Readings from Last 1 Encounters:  11/27/22 130/80         Passed - Last Heart Rate in normal range    Pulse Readings from Last 1 Encounters:  11/27/22 98         Passed - Valid encounter within last 6 months    Recent Outpatient Visits           3 weeks ago Hypertension associated with diabetes [facility])   [facility] [place], Marzella Schlein, MD   4 months ago Cyst of left breast   [place] [facility] [place], Marzella Schlein, MD   7 months ago Type 2 diabetes mellitus with other specified complication, without long-term current use of insulin [facility])   [facility] [place], Marzella Schlein, MD   9 months ago [employer] preventive visit   [facility] [place], Marzella Schlein, MD   1 year ago Otalgia of both ears   [facility] Merita [place] T, [place]       Future Appointments             In 2 months Bacigalupo,  Marzella Schlein, MD [facility]

## 2022-12-28 LAB — HM DIABETES EYE EXAM

## 2023-01-26 ENCOUNTER — Other Ambulatory Visit: Payer: Self-pay | Admitting: Family Medicine

## 2023-01-27 ENCOUNTER — Other Ambulatory Visit: Payer: Self-pay | Admitting: Family Medicine

## 2023-01-27 DIAGNOSIS — F419 Anxiety disorder, unspecified: Secondary | ICD-10-CM

## 2023-01-27 MED ORDER — CLONAZEPAM 0.5 MG PO TABS
ORAL_TABLET | ORAL | 0 refills | Status: DC
Start: 2023-01-27 — End: 2023-04-28

## 2023-01-27 NOTE — Telephone Encounter (Signed)
Medication Refill - Medication: clonazePAM (KLONOPIN) 0.5 MG tablet   Has the patient contacted their pharmacy? No. No, more refills.  (Agent: If yes, when and what did the pharmacy advise?)  Preferred Pharmacy (with phone number or street name):  [facility] - [place], [place] - [address]  [address] [place] [address]  Phone: [phone] Fax: [phone]  Hours: Not open 24 hours   Has the patient been seen for an appointment in the last year OR does the patient have an upcoming appointment? Yes.    Agent: Please be advised that RX refills may take up to 3 business days. We ask that you follow-up with your pharmacy.

## 2023-01-28 ENCOUNTER — Telehealth: Payer: Self-pay

## 2023-01-28 ENCOUNTER — Other Ambulatory Visit: Payer: Self-pay | Admitting: *Deleted

## 2023-01-28 ENCOUNTER — Telehealth: Payer: Self-pay | Admitting: *Deleted

## 2023-01-28 DIAGNOSIS — Z1211 Encounter for screening for malignant neoplasm of colon: Secondary | ICD-10-CM

## 2023-01-28 MED ORDER — PEG-3350/ELECTROLYTES 236 G PO SOLR: 4000.0000 mL | Freq: Once | ORAL | 0 refills | Status: AC

## 2023-01-28 MED ORDER — PEG 3350-KCL-NABCB-NACL-NASULF 236 G PO SOLR
4000.0000 mL | Freq: Once | ORAL | 0 refills | Status: DC
Start: 2023-01-28 — End: 2023-01-28

## 2023-01-28 NOTE — Telephone Encounter (Signed)
Message left for patient to return my call.  

## 2023-01-28 NOTE — Addendum Note (Signed)
Addended by: Tawnya Crook on: 01/28/2023 03:31 PM   Modules accepted: Orders

## 2023-01-28 NOTE — Telephone Encounter (Signed)
Colonoscopy schedule on 03/11/2023 with Dr Tobi Bastos

## 2023-01-28 NOTE — Telephone Encounter (Signed)
Gastroenterology Pre-Procedure Review  Request Date: 03/11/2023 Requesting Physician: Dr. Tobi Bastos  PATIENT REVIEW QUESTIONS: The patient responded to the following health history questions as indicated:    1. Are you having any GI issues? no 2. Do you have a personal history of Polyps? no 3. Do you have a family history of Colon Cancer or Polyps? no 4. Diabetes Mellitus? no 5. Joint replacements in the past 12 months?no 6. Major health problems in the past 3 months?no 7. Any artificial heart valves, MVP, or defibrillator?no    MEDICATIONS & ALLERGIES:    Patient reports the following regarding taking any anticoagulation/antiplatelet therapy:   Plavix, Coumadin, Eliquis, Xarelto, Lovenox, Pradaxa, Brilinta, or Effient? no Aspirin? yes (81 mg)  Patient confirms/reports the following medications:  Current Outpatient Medications  Medication Sig Dispense Refill   aspirin 81 MG tablet      clonazePAM (KLONOPIN) 0.5 MG tablet TAKE ONE-HALF TO ONE TABLET BY MOUTH TWICE DAILY AS NEEDED FOR ANXIETY 30 tablet 0   lisinopril (ZESTRIL) 5 MG tablet TAKE 1 TABLET BY MOUTH DAILY 90 tablet 0   meloxicam (MOBIC) 7.5 MG tablet Take 1 tablet (7.5 mg total) by mouth every 12 (twelve) hours. 60 tablet 5   rosuvastatin (CRESTOR) 5 MG tablet Take 1 tablet (5 mg total) by mouth daily. 90 tablet 3   verapamil (CALAN-SR) 120 MG CR tablet TAKE ONE TABLET BY MOUTH DAILY 90 tablet 0   verapamil (CALAN-SR) 240 MG CR tablet TAKE ONE TABLET BY MOUTH AT BEDTIME 90 tablet 1   verapamil (CALAN-SR) 240 MG CR tablet Take 1 tablet (240 mg total) by mouth at bedtime. (Patient not taking: Reported on 07/30/2022) 90 tablet 1   verapamil (CALAN-SR) 240 MG CR tablet TAKE ONE TABLET BY MOUTH AT BEDTIME 90 tablet 0   No current facility-administered medications for this visit.    Patient confirms/reports the following allergies:  No Known Allergies  No orders of the defined types were placed in this  encounter.   AUTHORIZATION INFORMATION Primary Insurance: 1D#: Group #:  Secondary Insurance: 1D#: Group #:  SCHEDULE INFORMATION: Date: 03/11/2023 Time: Location:  [facility]

## 2023-01-28 NOTE — Telephone Encounter (Signed)
Patient called in to schedule procedure

## 2023-01-29 DIAGNOSIS — S5002XA Contusion of left elbow, initial encounter: Secondary | ICD-10-CM | POA: Diagnosis not present

## 2023-02-01 ENCOUNTER — Other Ambulatory Visit: Payer: Self-pay | Admitting: Family Medicine

## 2023-03-01 ENCOUNTER — Other Ambulatory Visit: Payer: Self-pay | Admitting: Family Medicine

## 2023-03-11 ENCOUNTER — Ambulatory Visit
Admission: RE | Admit: 2023-03-11 | Discharge: 2023-03-11 | Disposition: A | Discharge status: Home / Self Care | Payer: Medicare HMO | Attending: Gastroenterology | Admitting: Gastroenterology

## 2023-03-11 ENCOUNTER — Encounter: Payer: Self-pay | Admitting: Gastroenterology

## 2023-03-11 ENCOUNTER — Ambulatory Visit: Payer: Medicare HMO | Admitting: Certified Registered"

## 2023-03-11 ENCOUNTER — Encounter
Admission: RE | Disposition: A | Discharge status: Home / Self Care | Payer: Self-pay | Source: Home / Self Care | Attending: Gastroenterology

## 2023-03-11 DIAGNOSIS — D126 Benign neoplasm of colon, unspecified: Secondary | ICD-10-CM

## 2023-03-11 DIAGNOSIS — Z1211 Encounter for screening for malignant neoplasm of colon: Secondary | ICD-10-CM | POA: Insufficient documentation

## 2023-03-11 DIAGNOSIS — D125 Benign neoplasm of sigmoid colon: Secondary | ICD-10-CM | POA: Insufficient documentation

## 2023-03-11 DIAGNOSIS — K635 Polyp of colon: Secondary | ICD-10-CM | POA: Diagnosis not present

## 2023-03-11 DIAGNOSIS — I10 Essential (primary) hypertension: Secondary | ICD-10-CM | POA: Insufficient documentation

## 2023-03-11 HISTORY — PX: COLONOSCOPY WITH PROPOFOL: SHX5780

## 2023-03-11 HISTORY — PX: POLYPECTOMY: SHX5525

## 2023-03-11 SURGERY — COLONOSCOPY WITH PROPOFOL
Anesthesia: General

## 2023-03-11 MED ORDER — LIDOCAINE HCL (CARDIAC) PF 100 MG/5ML IV SOSY
PREFILLED_SYRINGE | INTRAVENOUS | Status: DC | PRN
  Administered 2023-03-11: 100 mg via INTRAVENOUS

## 2023-03-11 MED ORDER — PROPOFOL 500 MG/50ML IV EMUL
INTRAVENOUS | Status: DC | PRN
  Administered 2023-03-11: 165 ug/kg/min via INTRAVENOUS

## 2023-03-11 MED ORDER — SODIUM CHLORIDE 0.9 % IV SOLN
INTRAVENOUS | Status: DC
  Administered 2023-03-11: 1000 mL via INTRAVENOUS

## 2023-03-11 MED ORDER — PROPOFOL 10 MG/ML IV BOLUS
INTRAVENOUS | Status: DC | PRN
  Administered 2023-03-11: 30 mg via INTRAVENOUS
  Administered 2023-03-11: 70 mg via INTRAVENOUS

## 2023-03-11 NOTE — Anesthesia Preprocedure Evaluation (Signed)
Anesthesia Evaluation  Patient identified by MRN, date of birth, ID band Patient awake    Reviewed: Allergy & Precautions, NPO status , Patient's Chart, lab work & pertinent test results  Airway Mallampati: III  TM Distance: >3 FB Neck ROM: full    Dental  (+) Chipped, Dental Advidsory Given   Pulmonary neg pulmonary ROS   Pulmonary exam normal        Cardiovascular hypertension, On Medications negative cardio ROS Normal cardiovascular exam     Neuro/Psych  PSYCHIATRIC DISORDERS      negative neurological ROS     GI/Hepatic negative GI ROS, Neg liver ROS,,,  Endo/Other    Renal/GU Renal disease  negative genitourinary   Musculoskeletal   Abdominal   Peds  Hematology negative hematology ROS (+)   Anesthesia Other Findings Past Medical History: No date: Anxiety No date: Hyperglycemia No date: Hyperlipidemia No date: Hypertension  Past Surgical History: 2010: ABDOMINAL HYSTERECTOMY     Comment:  still has ovaries  BMI    Body Mass Index: 30.16 kg/m      Reproductive/Obstetrics negative OB ROS                             Anesthesia Physical Anesthesia Plan  ASA: 2  Anesthesia Plan: General   Post-op Pain Management: Minimal or no pain anticipated   Induction: Intravenous  PONV Risk Score and Plan: 3 and Propofol infusion, TIVA and Ondansetron  Airway Management Planned: Nasal Cannula  Additional Equipment: None  Intra-op Plan:   Post-operative Plan:   Informed Consent: I have reviewed the patients History and Physical, chart, labs and discussed the procedure including the risks, benefits and alternatives for the proposed anesthesia with the patient or authorized representative who has indicated his/her understanding and acceptance.     Dental advisory given  Plan Discussed with: CRNA and Surgeon  Anesthesia Plan Comments: (Discussed risks of anesthesia with  patient, including possibility of difficulty with spontaneous ventilation under anesthesia necessitating airway intervention, PONV, and rare risks such as cardiac or respiratory or neurological events, and allergic reactions. Discussed the role of CRNA in patient's perioperative care. Patient understands.)       Anesthesia Quick Evaluation

## 2023-03-11 NOTE — H&P (Signed)
Wyline Mood, MD [address], [place], [place], [address] [address], [place], [place], [address] Phone: [phone]  Fax: [phone]  Primary Care Physician:  Erasmo Downer, MD   Pre-Procedure History & Physical: HPI:  Monica Hoffman is a 67 y.o. female is here for an colonoscopy.   Past Medical History:  Diagnosis Date   Anxiety    Hyperglycemia    Hyperlipidemia    Hypertension     Past Surgical History:  Procedure Laterality Date   ABDOMINAL HYSTERECTOMY  2010   still has ovaries    Prior to Admission medications   Medication Sig Start Date End Date Taking? Authorizing Provider  aspirin 81 MG tablet  11/16/07  Yes [provider]  lisinopril (ZESTRIL) 5 MG tablet TAKE 1 TABLET BY MOUTH DAILY 03/01/23  Yes Bacigalupo, Marzella Schlein, MD  meloxicam (MOBIC) 7.5 MG tablet Take 1 tablet (7.5 mg total) by mouth every 12 (twelve) hours. 07/30/22  Yes Bacigalupo, Marzella Schlein, MD  rosuvastatin (CRESTOR) 5 MG tablet Take 1 tablet (5 mg total) by mouth daily. 07/30/22  Yes Bacigalupo, Marzella Schlein, MD  verapamil (CALAN-SR) 120 MG CR tablet TAKE ONE TABLET BY MOUTH DAILY 01/27/23  Yes Ostwalt, Edmon Crape, PA-C  verapamil (CALAN-SR) 240 MG CR tablet TAKE ONE TABLET BY MOUTH AT BEDTIME 12/24/21  Yes Jacky Kindle, FNP  clonazePAM (KLONOPIN) 0.5 MG tablet TAKE ONE-HALF TO ONE TABLET BY MOUTH TWICE DAILY AS NEEDED FOR ANXIETY 01/27/23   Pardue, Monico Blitz, DO  verapamil (CALAN-SR) 240 MG CR tablet Take 1 tablet (240 mg total) by mouth at bedtime. 12/24/21   Jacky Kindle, FNP  verapamil (CALAN-SR) 240 MG CR tablet TAKE ONE TABLET BY MOUTH AT BEDTIME 09/25/22   Erasmo Downer, MD    Allergies as of 01/28/2023   (No Known Allergies)    Family History  Problem Relation Age of Onset   Brain cancer Mother    Diabetes Mother    COPD Father    Diabetes Father    Stroke Brother    Leukemia Brother    Breast cancer Neg Hx     Social History    Socioeconomic History   Marital status: Married    Spouse name: Not on file   Number of children: Not on file   Years of education: Not on file   Highest education level: Not on file  Occupational History   Not on file  Tobacco Use   Smoking status: Never   Smokeless tobacco: Never  Vaping Use   Vaping status: Never Used  Substance and Sexual Activity   Alcohol use: Yes    Alcohol/week: 0.0 standard drinks of alcohol    Comment: on occasion   Drug use: Never   Sexual activity: Not on file  Other Topics Concern   Not on file  Social History Narrative   Not on file   Social Determinants of Health   Financial Resource Strain: Not on file  Food Insecurity: Not on file  Transportation Needs: Not on file  Physical Activity: Not on file  Stress: Not on file  Social Connections: Not on file  Intimate Partner Violence: Not on file    Review of Systems: See HPI, otherwise negative ROS  Physical Exam: BP (!) 162/109   Pulse 100   Temp (!) 96.6 F (35.9 C) (Temporal)   Resp 18   Ht 5\' 9"  (1.753 m)   Wt 92.6 kg  SpO2 97%   BMI 30.16 kg/m  General:   Alert,  pleasant and cooperative in NAD Head:  Normocephalic and atraumatic. Neck:  Supple; no masses or thyromegaly. Lungs:  Clear throughout to auscultation, normal respiratory effort.    Heart:  +S1, +S2, Regular rate and rhythm, No edema. Abdomen:  Soft, nontender and nondistended. Normal bowel sounds, without guarding, and without rebound.   Neurologic:  Alert and  oriented x4;  grossly normal neurologically.  Impression/Plan: Monica Hoffman is here for an colonoscopy to be performed for Screening colonoscopy average risk   Risks, benefits, limitations, and alternatives regarding  colonoscopy have been reviewed with the patient.  Questions have been answered.  All parties agreeable.   Wyline Mood, MD  03/11/2023, 8:27 AM

## 2023-03-11 NOTE — Anesthesia Procedure Notes (Signed)
Date/Time: 03/11/2023 9:10 AM  Performed by: Mohammed Kindle, CRNAPre-anesthesia Checklist: Patient identified, Emergency Drugs available, Suction available and Patient being monitored Patient Re-evaluated:Patient Re-evaluated prior to induction Oxygen Delivery Method: Simple face mask Placement Confirmation: positive ETCO2, breath sounds checked- equal and bilateral and CO2 detector Dental Injury: Teeth and Oropharynx as per pre-operative assessment

## 2023-03-11 NOTE — Anesthesia Postprocedure Evaluation (Signed)
Anesthesia Post Note  Patient: Monica Hoffman  Procedure(s) Performed: COLONOSCOPY WITH PROPOFOL POLYPECTOMY  Patient location during evaluation: Endoscopy Anesthesia Type: General Level of consciousness: awake and alert Pain management: pain level controlled Vital Signs Assessment: post-procedure vital signs reviewed and stable Respiratory status: spontaneous breathing, nonlabored ventilation, respiratory function stable and patient connected to nasal cannula oxygen Cardiovascular status: blood pressure returned to baseline and stable Postop Assessment: no apparent nausea or vomiting Anesthetic complications: no  No notable events documented.   Last Vitals:  Vitals:   03/11/23 0941 03/11/23 0951  BP: 120/89 (!) 137/93  Pulse: 84 92  Resp: 18   Temp:    SpO2: 100% 100%    Last Pain:  Vitals:   03/11/23 0951  TempSrc:   PainSc: 0-No pain                 Stephanie Coup

## 2023-03-11 NOTE — Transfer of Care (Signed)
Immediate Anesthesia Transfer of Care Note  Patient: Monica Hoffman  Procedure(s) Performed: COLONOSCOPY WITH PROPOFOL POLYPECTOMY  Patient Location: Endoscopy Unit  Anesthesia Type:General  Level of Consciousness: drowsy and patient cooperative  Airway & Oxygen Therapy: Patient Spontanous Breathing and Patient connected to face mask oxygen  Post-op Assessment: Report given to RN and Post -op Vital signs reviewed and stable  Post vital signs: Reviewed and stable  Last Vitals:  Vitals Value Taken Time  BP 101/74 03/11/23 0931  Temp 35.9 C 03/11/23 0931  Pulse    Resp 18 03/11/23 0931  SpO2 100 % 03/11/23 0931    Last Pain:  Vitals:   03/11/23 0931  TempSrc: Temporal  PainSc: Asleep         Complications: No notable events documented.

## 2023-03-11 NOTE — Op Note (Signed)
[facility] Gastroenterology Patient Name: Monica Hoffman Procedure Date: 03/11/2023 9:07 AM MRN: [id number] Account #: [id number] Date of Birth: Jul 27, 1955 Admit Type: Outpatient Age: 67 Room: [facility] ENDO ROOM 2 Gender: Female Note Status: Finalized Instrument Name: Nelda Marseille [id number] Procedure:             Colonoscopy Indications:           Screening for colorectal malignant neoplasm Providers:             Wyline Mood MD, MD Referring MD:          Wyline Mood MD, MD (Referring MD), Marzella Schlein. Bacigalupo                         (Referring MD) Medicines:             Monitored Anesthesia Care Complications:         No immediate complications. Procedure:             Pre-Anesthesia Assessment:                        - Prior to the procedure, a History and Physical was                         performed, and patient medications, allergies and                         sensitivities were reviewed. The patient's tolerance                         of previous anesthesia was reviewed.                        - The risks and benefits of the procedure and the                         sedation options and risks were discussed with the                         patient. All questions were answered and informed                         consent was obtained.                        - ASA Grade Assessment: II - A patient with mild                         systemic disease.                        After obtaining informed consent, the colonoscope was                         passed under direct vision. Throughout the procedure,                         the patient's blood pressure, pulse, and oxygen                         saturations were  monitored continuously. The                         Colonoscope was introduced through the anus and                         advanced to the the cecum, identified by the                         appendiceal orifice. The colonoscopy was performed                          with ease. The colonoscopy was performed with ease.                         The patient tolerated the procedure well. The quality                         of the bowel preparation was excellent. The ileocecal                         valve, appendiceal orifice, and rectum were                         photographed. Findings:      The perianal and digital rectal examinations were normal.      A 13 mm polyp was found in the sigmoid colon. The polyp was       semi-pedunculated. The polyp was removed with a hot snare. Resection and       retrieval were complete.      The exam was otherwise without abnormality on direct and retroflexion       views. Impression:            - The procedure was aborted due to the difficulty of                         the procedure.                        - One 13 mm polyp in the sigmoid colon, removed with a                         hot snare. Resected and retrieved.                        - The examination was otherwise normal on direct and                         retroflexion views. Recommendation:        - Discharge patient to home (with escort).                        - Resume previous diet.                        - Continue present medications.                        - Await pathology results.                        -  Repeat colonoscopy in 3 years for surveillance. Procedure Code(s):     --- Professional ---                        [phone], Colonoscopy, flexible; with removal of                         tumor(s), polyp(s), or other lesion(s) by snare                         technique Diagnosis Code(s):     --- Professional ---                        Z12.11, Encounter for screening for malignant neoplasm                         of colon                        D12.5, Benign neoplasm of sigmoid colon CPT copyright 2022 American Medical Association. All rights reserved. The codes documented in this report are preliminary and upon coder review may  be revised to  meet current compliance requirements. Wyline Mood, MD Wyline Mood MD, MD 03/11/2023 9:30:31 AM This report has been signed electronically. Number of Addenda: 0 Note Initiated On: 03/11/2023 9:07 AM Scope Withdrawal Time: 0 hours 11 minutes 53 seconds  Total Procedure Duration: 0 hours 15 minutes 54 seconds  Estimated Blood Loss:  Estimated blood loss: none.      [facility]

## 2023-03-12 ENCOUNTER — Encounter: Payer: Self-pay | Admitting: Gastroenterology

## 2023-03-12 LAB — SURGICAL PATHOLOGY

## 2023-03-15 ENCOUNTER — Ambulatory Visit: Payer: Medicare HMO

## 2023-03-15 ENCOUNTER — Encounter: Payer: Self-pay | Admitting: Gastroenterology

## 2023-03-16 ENCOUNTER — Ambulatory Visit: Payer: Medicare HMO

## 2023-03-16 VITALS — BP 122/80 | Ht 69.0 in | Wt 203.4 lb

## 2023-03-16 DIAGNOSIS — Z0000 Encounter for general adult medical examination without abnormal findings: Secondary | ICD-10-CM

## 2023-03-16 NOTE — Progress Notes (Addendum)
Subjective:   Monica Hoffman is a 67 y.o. female who presents for Medicare Annual (Subsequent) preventive examination.  Visit Complete: In person  Cardiac Risk Factors include: advanced age (>28men, >65 women);dyslipidemia;hypertension;microalbuminuria;obesity (BMI >30kg/m2)     Objective:    Today's Vitals   03/16/23 1022 03/16/23 1027 03/16/23 1049  BP: (!) 142/88  122/80  Weight: 203 lb 6.4 oz (92.3 kg)    Height: 5\' 9"  (1.753 m)    PainSc:  0-No pain    Body mass index is 30.04 kg/m.     03/16/2023   10:34 AM 03/11/2023    8:20 AM 12/26/2016    1:19 AM 07/08/2015    2:49 PM  Advanced Directives  Does Patient Have a Medical Advance Directive? Yes Yes No No  Type of Estate agent of [place];Living will Healthcare Power of [place];Living will    Copy of Healthcare Power of Attorney in Chart? No - copy requested     Would patient like information on creating a medical advance directive?   No - Patient declined No - patient declined information    Current Medications (verified) Outpatient Encounter Medications as of 03/16/2023  Medication Sig   clonazePAM (KLONOPIN) 0.5 MG tablet TAKE ONE-HALF TO ONE TABLET BY MOUTH TWICE DAILY AS NEEDED FOR ANXIETY   lisinopril (ZESTRIL) 5 MG tablet TAKE 1 TABLET BY MOUTH DAILY   meloxicam (MOBIC) 7.5 MG tablet Take 1 tablet (7.5 mg total) by mouth every 12 (twelve) hours.   rosuvastatin (CRESTOR) 5 MG tablet Take 1 tablet (5 mg total) by mouth daily.   verapamil (CALAN-SR) 120 MG CR tablet TAKE ONE TABLET BY MOUTH DAILY   verapamil (CALAN-SR) 240 MG CR tablet TAKE ONE TABLET BY MOUTH AT BEDTIME   verapamil (CALAN-SR) 240 MG CR tablet Take 1 tablet (240 mg total) by mouth at bedtime.   verapamil (CALAN-SR) 240 MG CR tablet TAKE ONE TABLET BY MOUTH AT BEDTIME   aspirin 81 MG tablet  (Patient not taking: Reported on 03/16/2023)   No facility-administered encounter medications on file as of 03/16/2023.     Allergies (verified) Patient has no known allergies.   History: Past Medical History:  Diagnosis Date   Anxiety    Hyperglycemia    Hyperlipidemia    Hypertension    Past Surgical History:  Procedure Laterality Date   ABDOMINAL HYSTERECTOMY  2010   still has ovaries   COLONOSCOPY WITH PROPOFOL N/A 03/11/2023   Procedure: COLONOSCOPY WITH PROPOFOL;  Surgeon: Wyline Mood, MD;  Location: [facility] ENDOSCOPY;  Service: Gastroenterology;  Laterality: N/A;   POLYPECTOMY  03/11/2023   Procedure: POLYPECTOMY;  Surgeon: Wyline Mood, MD;  Location: [facility] ENDOSCOPY;  Service: Gastroenterology;;   Family History  Problem Relation Age of Onset   Brain cancer Mother    Diabetes Mother    COPD Father    Diabetes Father    Stroke Brother    Leukemia Brother    Breast cancer Neg Hx    Social History   Socioeconomic History   Marital status: Married    Spouse name: Not on file   Number of children: Not on file   Years of education: Not on file   Highest education level: Not on file  Occupational History   Not on file  Tobacco Use   Smoking status: Never   Smokeless tobacco: Never  Vaping Use   Vaping status: Never Used  Substance and Sexual Activity   Alcohol use: Yes  Alcohol/week: 0.0 standard drinks of alcohol    Comment: on occasion   Drug use: Never   Sexual activity: Not on file  Other Topics Concern   Not on file  Social History Narrative   Not on file   Social Determinants of Health   Financial Resource Strain: Low Risk  (03/16/2023)   Overall Financial Resource Strain (CARDIA)    Difficulty of Paying Living Expenses: Not hard at all  Food Insecurity: No Food Insecurity (03/16/2023)   Hunger Vital Sign    Worried About Running Out of Food in the Last Year: Never true    Ran Out of Food in the Last Year: Never true  Transportation Needs: No Transportation Needs (03/16/2023)   PRAPARE - Administrator, Civil Service (Medical): No    Lack of  Transportation (Non-Medical): No  Physical Activity: Insufficiently Active (03/16/2023)   Exercise Vital Sign    Days of Exercise per Week: 3 days    Minutes of Exercise per Session: 30 min  Stress: No Stress Concern Present (03/16/2023)   [employer] of Occupational Health - Occupational Stress Questionnaire    Feeling of Stress : Only a little  Social Connections: Moderately Integrated (03/16/2023)   Social Connection and Isolation Panel [NHANES]    Frequency of Communication with Friends and Family: Three times a week    Frequency of Social Gatherings with Friends and Family: Never    Attends Religious Services: More than 4 times per year    Active Member of [employer] or Organizations: No    Attends Engineer, structural: Never    Marital Status: Married    Tobacco Counseling Counseling given: Not Answered   Clinical Intake:  Pre-visit preparation completed: Yes  Pain : No/denies pain Pain Score: 0-No pain     BMI - recorded: 30.04 Nutritional Status: BMI > 30  Obese Nutritional Risks: None Diabetes: No  How often do you need to have someone help you when you read instructions, pamphlets, or other written materials from your doctor or pharmacy?: 1 - Never  Interpreter Needed?: No  Information entered by :: Kennedy Bucker, LPN   Activities of Daily Living    03/16/2023   10:37 AM 05/14/2022    8:33 AM  In your present state of health, do you have any difficulty performing the following activities:  Hearing? 0 0  Vision? 0 0  Difficulty concentrating or making decisions? 0 0  Walking or climbing stairs? 0 0  Dressing or bathing? 0 0  Doing errands, shopping? 0 0  Preparing Food and eating ? N   Using the Toilet? N   In the past six months, have you accidently leaked urine? N   Do you have problems with loss of bowel control? N   Managing your Medications? N   Managing your Finances? N   Housekeeping or managing your Housekeeping? N     Patient  Care Team: Erasmo Downer, MD as PCP - General (Family Medicine) Galen Manila, MD as Referring Physician (Ophthalmology)  Indicate any recent Medical Services you may have received from other than [facility] providers in the past year (date may be approximate).     Assessment:   This is a routine wellness examination for [place].  Hearing/Vision screen Hearing Screening - Comments:: NO AIDS Vision Screening - Comments:: [facility]- DR.PORFILIO   Goals Addressed             This Visit's Progress    DIET - EAT  MORE FRUITS AND VEGETABLES         Depression Screen    03/16/2023   10:30 AM 11/27/2022    8:45 AM 05/14/2022    8:32 AM 03/09/2022    2:12 PM 10/13/2021    8:56 AM 03/04/2021    8:19 AM 08/05/2020    8:28 AM  PHQ 2/9 Scores  PHQ - 2 Score 0 0 0 0 0 0 0  PHQ- 9 Score 0 3 6 1  0 2 6    Fall Risk    03/16/2023   10:36 AM 05/14/2022    8:32 AM 03/09/2022    1:31 PM 10/13/2021    8:56 AM 03/04/2021    8:19 AM  Fall Risk   Falls in the past year? 1 0 0 0 1  Number falls in past yr: 0 0 0 0 0  Injury with Fall? 1 0 0 0 1  Risk for fall due to : History of fall(s) No Fall Risks No Fall Risks No Fall Risks History of fall(s)  Follow up Falls evaluation completed;Falls prevention discussed Falls evaluation completed Falls evaluation completed Falls evaluation completed Falls evaluation completed;Education provided;Falls prevention discussed    MEDICARE RISK AT HOME: Medicare Risk at Home Any stairs in or around the home?: Yes If so, are there any without handrails?: No Home free of loose throw rugs in walkways, pet beds, electrical cords, etc?: Yes Adequate lighting in your home to reduce risk of falls?: Yes Life alert?: No Use of a cane, walker or w/c?: No Grab bars in the bathroom?: Yes Shower chair or bench in shower?: Yes Elevated toilet seat or a handicapped toilet?: Yes  TIMED UP AND GO:  Was the test performed?  Yes  Length of time to ambulate 10 feet: 4  sec Gait steady and fast without use of assistive device    Cognitive Function:        03/16/2023   10:39 AM  6CIT Screen  What Year? 0 points  What month? 0 points  What time? 0 points  Count back from 20 0 points  Months in reverse 2 points  Repeat phrase 0 points  Total Score 2 points    Immunizations Immunization History  Administered Date(s) Administered   Fluad Quad(high Dose 65+) 02/19/2022   Influenza Split 03/13/2011, 12/18/2011   Influenza,inj,Quad PF,6+ Mos 01/30/2013, 04/23/2014, 01/06/2016, 02/05/2017, 02/26/2018, 01/09/2019   Influenza-Unspecified 01/22/2020, 02/03/2021, 02/25/2022   PFIZER(Purple Top)SARS-COV-2 Vaccination 06/30/2019, 07/25/2019, 02/16/2020   PNEUMOCOCCAL CONJUGATE-20 11/27/2022   Pneumococcal Polysaccharide-23 08/05/2020   Td 01/09/2019   Tdap 10/13/2006   Zoster Recombinant(Shingrix) 08/29/2021, 10/31/2021    TDAP status: Up to date  Flu Vaccine status: Declined, Education has been provided regarding the importance of this vaccine but patient still declined. Advised may receive this vaccine at local pharmacy or Health Dept. Aware to provide a copy of the vaccination record if obtained from local pharmacy or Health Dept. Verbalized acceptance and understanding.  Pneumococcal vaccine status: Up to date  Covid-19 vaccine status: Completed vaccines  Qualifies for Shingles Vaccine? Yes   Zostavax completed No   Shingrix Completed?: Yes  Screening Tests Health Maintenance  Topic Date Due   COVID-19 Vaccine (4 - 2023-24 season) 12/06/2022   FOOT EXAM  03/10/2023   INFLUENZA VACCINE  07/05/2023 (Originally 11/05/2022)   Diabetic kidney evaluation - Urine ACR  05/15/2023   HEMOGLOBIN A1C  05/30/2023   Diabetic kidney evaluation - eGFR measurement  11/27/2023   OPHTHALMOLOGY EXAM  12/28/2023  Medicare Annual Wellness (AWV)  03/15/2024   MAMMOGRAM  07/21/2024   DTaP/Tdap/Td (3 - Td or Tdap) 01/08/2029   Colonoscopy  03/10/2033    Pneumonia Vaccine 81+ Years old  Completed   DEXA SCAN  Completed   Hepatitis C Screening  Completed   Zoster Vaccines- Shingrix  Completed   HPV VACCINES  Aged Out    Health Maintenance  Health Maintenance Due  Topic Date Due   COVID-19 Vaccine (4 - 2023-24 season) 12/06/2022   FOOT EXAM  03/10/2023    Colorectal cancer screening: Type of screening: Colonoscopy. Completed 03/11/23. Repeat every 3 years  Mammogram status: Completed 07/22/22. Repeat every year  Bone Density status: Completed 07/22/22. Results reflect: Bone density results: NORMAL. Repeat every 5 years.  Lung Cancer Screening: (Low Dose CT Chest recommended if Age 16-80 years, 20 pack-year currently smoking OR have quit w/in 15years.) does not qualify.    Additional Screening:  Hepatitis C Screening: does qualify; Completed 12/14/18  Vision Screening: Recommended annual ophthalmology exams for early detection of glaucoma and other disorders of the eye. Is the patient up to date with their annual eye exam?  Yes  Who is the provider or what is the name of the office in which the patient attends annual eye exams? DR.PORFILIO If pt is not established with a provider, would they like to be referred to a provider to establish care? No .   Dental Screening: Recommended annual dental exams for proper oral hygiene   Community Resource Referral / Chronic Care Management: CRR required this visit?  No   CCM required this visit?  No     Plan:     I have personally reviewed and noted the following in the patient's chart:   Medical and social history Use of alcohol, tobacco or illicit drugs  Current medications and supplements including opioid prescriptions. Patient is not currently taking opioid prescriptions. Functional ability and status Nutritional status Physical activity Advanced directives List of other physicians Hospitalizations, surgeries, and ER visits in previous 12 months Vitals Screenings to include  cognitive, depression, and falls Referrals and appointments  In addition, I have reviewed and discussed with patient certain preventive protocols, quality metrics, and best practice recommendations. A written personalized care plan for preventive services as well as general preventive health recommendations were provided to patient.     Hal Hope, LPN   [id number]   After Visit Summary: (In Person-Declined) Patient declined AVS at this time.  Nurse Notes: NONE

## 2023-03-16 NOTE — Patient Instructions (Addendum)
Ms. Monica Hoffman , Thank you for taking time to come for your Medicare Wellness Visit. I appreciate your ongoing commitment to your health goals. Please review the following plan we discussed and let me know if I can assist you in the future.   Referrals/Orders/Follow-Ups/Clinician Recommendations: NONE  This is a list of the screening recommended for you and due dates:  Health Maintenance  Topic Date Due   COVID-19 Vaccine (4 - 2023-24 season) 12/06/2022   Complete foot exam   03/10/2023   Flu Shot  07/05/2023*   Yearly kidney health urinalysis for diabetes  05/15/2023   Hemoglobin A1C  05/30/2023   Yearly kidney function blood test for diabetes  11/27/2023   Eye exam for diabetics  12/28/2023   Medicare Annual Wellness Visit  03/15/2024   Mammogram  07/21/2024   DTaP/Tdap/Td vaccine (3 - Td or Tdap) 01/08/2029   Colon Cancer Screening  03/10/2033   Pneumonia Vaccine  Completed   DEXA scan (bone density measurement)  Completed   Hepatitis C Screening  Completed   Zoster (Shingles) Vaccine  Completed   HPV Vaccine  Aged Out  *Topic was postponed. The date shown is not the original due date.    Advanced directives: (Copy Requested) Please bring a copy of your health care power of attorney and living will to the office to be added to your chart at your convenience.  Next Medicare Annual Wellness Visit scheduled for next year: Yes   03/21/24 @ 10:10 AM IN PERSON

## 2023-03-22 ENCOUNTER — Encounter: Payer: Self-pay | Admitting: Family Medicine

## 2023-03-22 ENCOUNTER — Ambulatory Visit (INDEPENDENT_AMBULATORY_CARE_PROVIDER_SITE_OTHER): Payer: Medicare HMO | Admitting: Family Medicine

## 2023-03-22 VITALS — BP 124/80 | HR 90 | Ht 69.0 in | Wt 203.7 lb

## 2023-03-22 DIAGNOSIS — Z636 Dependent relative needing care at home: Secondary | ICD-10-CM

## 2023-03-22 DIAGNOSIS — R809 Proteinuria, unspecified: Secondary | ICD-10-CM | POA: Diagnosis not present

## 2023-03-22 DIAGNOSIS — R4701 Aphasia: Secondary | ICD-10-CM

## 2023-03-22 DIAGNOSIS — E785 Hyperlipidemia, unspecified: Secondary | ICD-10-CM | POA: Diagnosis not present

## 2023-03-22 DIAGNOSIS — E1159 Type 2 diabetes mellitus with other circulatory complications: Secondary | ICD-10-CM

## 2023-03-22 DIAGNOSIS — E1169 Type 2 diabetes mellitus with other specified complication: Secondary | ICD-10-CM | POA: Diagnosis not present

## 2023-03-22 DIAGNOSIS — Z0000 Encounter for general adult medical examination without abnormal findings: Secondary | ICD-10-CM | POA: Diagnosis not present

## 2023-03-22 DIAGNOSIS — E1129 Type 2 diabetes mellitus with other diabetic kidney complication: Secondary | ICD-10-CM

## 2023-03-22 DIAGNOSIS — I152 Hypertension secondary to endocrine disorders: Secondary | ICD-10-CM

## 2023-03-22 DIAGNOSIS — Z0001 Encounter for general adult medical examination with abnormal findings: Secondary | ICD-10-CM

## 2023-03-22 NOTE — Progress Notes (Signed)
9    Complete physical exam   Patient: Monica Hoffman   DOB: 13-Dec-1955   67 y.o. Female  MRN: [id number] Visit Date: 03/22/2023  Today's healthcare provider: Shirlee Latch, MD   Chief Complaint  Patient presents with   Annual Exam    AWV 03/16/23. Patient reports eating keto bread and other than that she consumes salads. She reports walking 3 times a week for 30 minutes. She reports feeling well and sleeping well. She reports concerns with her left ear as well as patients have mentioned to her she is talking slower and slurring her speech.    Subjective    Monica Hoffman is a 67 y.o. female who presents today for a complete physical exam.    Discussed the use of AI scribe software for clinical note transcription with the patient, who gave verbal consent to proceed.  History of Present Illness   The patient, with a history of hypertension and hyperlipidemia, presents with a change in voice quality and slurring of speech that has been ongoing for a couple of months. The patient reports that the change in voice was noticed by others who questioned if she had a stroke. The patient denies any associated symptoms such as numbness, weakness, or difficulty finding words. The patient also reports a recent fall at work due to a puddle of water, but denies any head trauma or fractures. The patient is currently under significant stress due to caregiving for her husband who has dementia. The patient also mentions a recent colonoscopy where a 13mm tubular adenoma, a precancerous polyp, was removed. The patient is scheduled for a repeat colonoscopy in three years.        Last depression screening scores    03/22/2023   10:24 AM 03/16/2023   10:30 AM 11/27/2022    8:45 AM  PHQ 2/9 Scores  PHQ - 2 Score 0 0 0  PHQ- 9 Score 1 0 3   Last fall risk screening    03/22/2023   10:24 AM  Fall Risk   Falls in the past year? 1  Number falls in past yr: 0  Injury with Fall? 1  Risk for fall  due to : History of fall(s)  Follow up Falls evaluation completed        Medications: Outpatient Medications Prior to Visit  Medication Sig   aspirin 81 MG tablet    lisinopril (ZESTRIL) 5 MG tablet TAKE 1 TABLET BY MOUTH DAILY   meloxicam (MOBIC) 7.5 MG tablet Take 1 tablet (7.5 mg total) by mouth every 12 (twelve) hours.   rosuvastatin (CRESTOR) 5 MG tablet Take 1 tablet (5 mg total) by mouth daily.   verapamil (CALAN-SR) 120 MG CR tablet TAKE ONE TABLET BY MOUTH DAILY   verapamil (CALAN-SR) 240 MG CR tablet TAKE ONE TABLET BY MOUTH AT BEDTIME   verapamil (CALAN-SR) 240 MG CR tablet Take 1 tablet (240 mg total) by mouth at bedtime.   verapamil (CALAN-SR) 240 MG CR tablet TAKE ONE TABLET BY MOUTH AT BEDTIME   clonazePAM (KLONOPIN) 0.5 MG tablet TAKE ONE-HALF TO ONE TABLET BY MOUTH TWICE DAILY AS NEEDED FOR ANXIETY (Patient not taking: Reported on 03/22/2023)   No facility-administered medications prior to visit.    Review of Systems    Objective    BP 124/80 (BP Location: Right Arm, Patient Position: Sitting, Cuff Size: Large)   Pulse 90   Ht 5\' 9"  (1.753 m)   Wt 203 lb 11.2 oz (92.4 kg)  BMI 30.08 kg/m    Physical Exam Vitals reviewed.  Constitutional:      General: She is not in acute distress.    Appearance: Normal appearance. She is well-developed. She is not diaphoretic.  HENT:     Head: Normocephalic and atraumatic.     Right Ear: Tympanic membrane, ear canal and external ear normal.     Left Ear: Tympanic membrane, ear canal and external ear normal.     Nose: Nose normal.     Mouth/Throat:     Mouth: Mucous membranes are moist.     Pharynx: Oropharynx is clear. No oropharyngeal exudate.  Eyes:     General: No scleral icterus.    Conjunctiva/sclera: Conjunctivae normal.     Pupils: Pupils are equal, round, and reactive to light.  Neck:     Thyroid: No thyromegaly.  Cardiovascular:     Rate and Rhythm: Normal rate and regular rhythm.     Heart sounds:  Normal heart sounds. No murmur heard. Pulmonary:     Effort: Pulmonary effort is normal. No respiratory distress.     Breath sounds: Normal breath sounds. No wheezing or rales.  Abdominal:     General: There is no distension.     Palpations: Abdomen is soft.     Tenderness: There is no abdominal tenderness.  Musculoskeletal:        General: No deformity.     Cervical back: Neck supple.     Right lower leg: No edema.     Left lower leg: No edema.  Lymphadenopathy:     Cervical: No cervical adenopathy.  Skin:    General: Skin is warm and dry.     Findings: No rash.  Neurological:     Mental Status: She is alert and oriented to person, place, and time. Mental status is at baseline.     Cranial Nerves: No cranial nerve deficit.     Sensory: No sensory deficit.     Motor: No weakness.     Gait: Gait normal.     Comments: +aphasia  Psychiatric:        Mood and Affect: Mood normal.        Behavior: Behavior normal.        Thought Content: Thought content normal.      No results found for any visits on 03/22/23.  Assessment & Plan    Routine Health Maintenance and Physical Exam  Exercise Activities and Dietary recommendations  Goals      DIET - EAT MORE FRUITS AND VEGETABLES        Immunization History  Administered Date(s) Administered   Fluad Quad(high Dose 65+) 02/19/2022   Influenza Split 03/13/2011, 12/18/2011   Influenza,inj,Quad PF,6+ Mos 01/30/2013, 04/23/2014, 01/06/2016, 02/05/2017, 02/26/2018, 01/09/2019   Influenza-Unspecified 01/22/2020, 02/03/2021, 02/25/2022   PFIZER(Purple Top)SARS-COV-2 Vaccination 06/30/2019, 07/25/2019, 02/16/2020   PNEUMOCOCCAL CONJUGATE-20 11/27/2022   Pneumococcal Polysaccharide-23 08/05/2020   Td 01/09/2019   Tdap 10/13/2006   Zoster Recombinant(Shingrix) 08/29/2021, 10/31/2021    Health Maintenance  Topic Date Due   COVID-19 Vaccine (4 - 2024-25 season) 12/06/2022   FOOT EXAM  03/10/2023   INFLUENZA VACCINE  07/05/2023  (Originally 11/05/2022)   Diabetic kidney evaluation - Urine ACR  05/15/2023   HEMOGLOBIN A1C  05/30/2023   Diabetic kidney evaluation - eGFR measurement  11/27/2023   OPHTHALMOLOGY EXAM  12/28/2023   Medicare Annual Wellness (AWV)  03/15/2024   MAMMOGRAM  07/21/2024   Colonoscopy  03/10/2026   DTaP/Tdap/Td (3 - Td  or Tdap) 01/08/2029   Pneumonia Vaccine 24+ Years old  Completed   DEXA SCAN  Completed   Hepatitis C Screening  Completed   Zoster Vaccines- Shingrix  Completed   HPV VACCINES  Aged Out    Discussed health benefits of physical activity, and encouraged her to engage in regular exercise appropriate for her age and condition.  Problem List Items Addressed This Visit       Cardiovascular and Mediastinum   Hypertension associated with diabetes (HCC)   Relevant Orders   Comprehensive metabolic panel     Endocrine   Hyperlipidemia associated with type 2 diabetes mellitus (HCC)   Relevant Orders   Comprehensive metabolic panel   Lipid Panel With LDL/HDL Ratio   T2DM (type 2 diabetes mellitus) (HCC)   Relevant Orders   Hemoglobin A1c   Microalbuminuria due to type 2 diabetes mellitus (HCC)   Other Visit Diagnoses       Encounter for annual physical exam    -  Primary   Relevant Orders   Comprehensive metabolic panel   Lipid Panel With LDL/HDL Ratio   Hemoglobin A1c     Aphasia       Relevant Orders   MR Brain W Wo Contrast     Caregiver stress               Aphasia Voice change and slurring of speech for the past couple of months. Differential diagnosis includes stroke, TIA, or other neurological conditions. Family history of brain tumor and leukemia. No associated numbness, weakness, or language processing issues. Physical exam shows no other neurological deficits. Discussed need for MRI to determine if there was a stroke or other brain changes. Informed about MRI process and potential discomfort, advised taking Klonopin for claustrophobia. Instructed to go to  the ER if acute symptoms like sudden vision loss or new weakness/numbness occur. 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 Not related to voice change or tonsillitis. Explained that the salivary glands are working overtime due to dry mouth, causing swelling and tenderness. - Continue using Biotene - Encourage hydration  Hypertension Blood pressure slightly elevated today. On Verapamil 240 mg daily and Lisinopril 5 mg daily. Rechecked blood pressure before leaving, noted improvement. - Recheck blood pressure before leaving - Continue current medications  Hyperlipidemia On Crestor 5 mg daily. Recent cholesterol levels were within target range. Discussed importance of continuing medication and monitoring cholesterol levels. - Continue current medication - Order full lab panel including A1c, kidney and liver function tests  General Health Maintenance Up to date on pneumonia, shingles, and tetanus vaccines. Plans to get a flu shot. Recent urine test for diabetes was normal. Discussed importance of regular screenings and lab tests. - Order full lab panel including A1c, kidney and liver function tests - Schedule six-month follow-up  Follow-up - Schedule six-month follow-up - Call with MRI results.        Return in about 6 months (around 09/20/2023) for chronic disease f/u.     Shirlee Latch, MD  [facility] [phone] (phone) [phone] (fax)  [facility]

## 2023-03-22 NOTE — Addendum Note (Signed)
Addended by: Lily Kocher on: 03/22/2023 04:43 PM   Modules accepted: Orders

## 2023-03-23 LAB — COMPREHENSIVE METABOLIC PANEL
ALT: 19 [IU]/L (ref 0–32)
AST: 23 [IU]/L (ref 0–40)
Albumin: 4.8 g/dL (ref 3.9–4.9)
Alkaline Phosphatase: 78 [IU]/L (ref 44–121)
BUN/Creatinine Ratio: 16 (ref 12–28)
BUN: 16 mg/dL (ref 8–27)
Bilirubin Total: 0.4 mg/dL (ref 0.0–1.2)
CO2: 21 mmol/L (ref 20–29)
Calcium: 10.1 mg/dL (ref 8.7–10.3)
Chloride: 107 mmol/L — ABNORMAL HIGH (ref 96–106)
Creatinine, Ser: 1.01 mg/dL — ABNORMAL HIGH (ref 0.57–1.00)
Globulin, Total: 2.3 g/dL (ref 1.5–4.5)
Glucose: 110 mg/dL — ABNORMAL HIGH (ref 70–99)
Potassium: 4.3 mmol/L (ref 3.5–5.2)
Sodium: 145 mmol/L — ABNORMAL HIGH (ref 134–144)
Total Protein: 7.1 g/dL (ref 6.0–8.5)
eGFR: 61 mL/min/{1.73_m2} (ref >59)

## 2023-03-23 LAB — LIPID PANEL WITH LDL/HDL RATIO
Cholesterol, Total: 172 mg/dL (ref 100–199)
HDL: 57 mg/dL (ref >39)
LDL Chol Calc (NIH): 93 mg/dL (ref 0–99)
LDL/HDL Ratio: 1.6 {ratio} (ref 0.0–3.2)
Triglycerides: 126 mg/dL (ref 0–149)
VLDL Cholesterol Cal: 22 mg/dL (ref 5–40)

## 2023-03-23 LAB — HEMOGLOBIN A1C
Est. average glucose Bld gHb Est-mCnc: 143 mg/dL
Hgb A1c MFr Bld: 6.6 % — ABNORMAL HIGH (ref 4.8–5.6)

## 2023-03-23 LAB — MICROALBUMIN / CREATININE URINE RATIO
Creatinine, Urine: 202.4 mg/dL
Microalb/Creat Ratio: 17 mg/g{creat} (ref 0–29)
Microalbumin, Urine: 35 ug/mL

## 2023-03-23 LAB — SPECIMEN STATUS REPORT

## 2023-04-05 ENCOUNTER — Telehealth: Payer: Self-pay

## 2023-04-09 ENCOUNTER — Ambulatory Visit: Payer: Medicare HMO

## 2023-04-28 ENCOUNTER — Other Ambulatory Visit: Payer: Self-pay | Admitting: Family Medicine

## 2023-04-28 DIAGNOSIS — F419 Anxiety disorder, unspecified: Secondary | ICD-10-CM

## 2023-04-29 ENCOUNTER — Other Ambulatory Visit: Payer: Self-pay | Admitting: Family Medicine

## 2023-04-29 MED ORDER — CLONAZEPAM 0.5 MG PO TABS: ORAL_TABLET | ORAL | 0 refills | Status: DC

## 2023-04-29 NOTE — Telephone Encounter (Signed)
Requested Prescriptions  Pending Prescriptions Disp Refills   verapamil (CALAN-SR) 240 MG CR tablet [Pharmacy Med Name: VERAPAMIL HCL ER 240 MG TAB] 90 tablet 1    Sig: TAKE ONE TABLET BY MOUTH AT BEDTIME     Cardiovascular: Calcium Channel Blockers 3 Failed - 04/29/2023  1:18 PM      Failed - Cr in normal range and within 360 days    Creat  Date Value Ref Range Status  02/08/2017 0.97 0.50 - 0.99 mg/dL Final    Comment:    For patients >67 years of age, the reference limit for Creatinine is approximately 13% higher for people identified as African-American. .    Creatinine, Ser  Date Value Ref Range Status  03/22/2023 1.01 (H) 0.57 - 1.00 mg/dL Final         Passed - ALT in normal range and within 360 days    ALT  Date Value Ref Range Status  03/22/2023 19 0 - 32 IU/L Final         Passed - AST in normal range and within 360 days    AST  Date Value Ref Range Status  03/22/2023 23 0 - 40 IU/L Final         Passed - Last BP in normal range    BP Readings from Last 1 Encounters:  03/22/23 124/80         Passed - Last Heart Rate in normal range    Pulse Readings from Last 1 Encounters:  03/22/23 90         Passed - Valid encounter within last 6 months    Recent Outpatient Visits           1 month ago Encounter for annual physical exam   [place] [facility] [place], Marzella Schlein, MD   5 months ago Hypertension associated with diabetes [facility])   [place] [facility] [place], Marzella Schlein, MD   9 months ago Cyst of left breast   [place] [facility] [place], Marzella Schlein, MD   11 months ago Type 2 diabetes mellitus with other specified complication, without long-term current use of insulin [facility])   [place] [facility] [place], Marzella Schlein, MD   1 year ago Welcome to [facility] preventive visit   [place] [facility] [place], Marzella Schlein, MD       Future Appointments              In 4 months Bacigalupo, Marzella Schlein, MD [facility], [facility]

## 2023-05-07 ENCOUNTER — Ambulatory Visit
Admission: RE | Admit: 2023-05-07 | Discharge: 2023-05-07 | Disposition: A | Discharge status: Home / Self Care | Payer: Medicare HMO | Source: Ambulatory Visit | Attending: Family Medicine | Admitting: Family Medicine

## 2023-05-07 DIAGNOSIS — R4701 Aphasia: Secondary | ICD-10-CM | POA: Diagnosis not present

## 2023-05-07 DIAGNOSIS — R29818 Other symptoms and signs involving the nervous system: Secondary | ICD-10-CM | POA: Diagnosis not present

## 2023-05-07 DIAGNOSIS — I6782 Cerebral ischemia: Secondary | ICD-10-CM | POA: Diagnosis not present

## 2023-05-07 MED ORDER — GADOBUTROL 1 MMOL/ML IV SOLN
9.0000 mL | Freq: Once | INTRAVENOUS | Status: AC | PRN
  Administered 2023-05-07: 9 mL via INTRAVENOUS

## 2023-05-20 ENCOUNTER — Encounter: Payer: Self-pay | Admitting: Family Medicine

## 2023-05-20 DIAGNOSIS — H9203 Otalgia, bilateral: Secondary | ICD-10-CM

## 2023-05-24 NOTE — Telephone Encounter (Signed)
 Ok to refer to ENT for ear pain [facility] ENT Dr Jenne Campus)

## 2023-05-27 ENCOUNTER — Other Ambulatory Visit: Payer: Self-pay | Admitting: Family Medicine

## 2023-05-28 NOTE — Telephone Encounter (Signed)
 Requested Prescriptions  Pending Prescriptions Disp Refills   lisinopril (ZESTRIL) 5 MG tablet [Pharmacy Med Name: LISINOPRIL 5 MG TAB] 90 tablet 1    Sig: TAKE 1 TABLET BY MOUTH DAILY     Cardiovascular:  ACE Inhibitors Failed - 05/28/2023  9:24 AM      Failed - Cr in normal range and within 180 days    Creat  Date Value Ref Range Status  02/08/2017 0.97 0.50 - 0.99 mg/dL Final    Comment:    For patients >67 years of age, the reference limit for Creatinine is approximately 13% higher for people identified as African-American. .    Creatinine, Ser  Date Value Ref Range Status  03/22/2023 1.01 (H) 0.57 - 1.00 mg/dL Final         Passed - K in normal range and within 180 days    Potassium  Date Value Ref Range Status  03/22/2023 4.3 3.5 - 5.2 mmol/L Final         Passed - Patient is not pregnant      Passed - Last BP in normal range    BP Readings from Last 1 Encounters:  03/22/23 124/80         Passed - Valid encounter within last 6 months    Recent Outpatient Visits           2 months ago Encounter for annual physical exam   [place] [facility] [place], Marzella Schlein, MD   6 months ago Hypertension associated with diabetes [facility])   [place] [facility] [place], Marzella Schlein, MD   10 months ago Cyst of left breast   [place] [facility] [place], Marzella Schlein, MD   1 year ago Type 2 diabetes mellitus with other specified complication, without long-term current use of insulin [facility])    [facility] [place], Marzella Schlein, MD   1 year ago [facility] preventive visit   [facility] [place], Marzella Schlein, MD       Future Appointments             In 3 months Bacigalupo, Marzella Schlein, MD [facility], [facility]

## 2023-06-08 ENCOUNTER — Other Ambulatory Visit: Payer: Self-pay | Admitting: Physician Assistant

## 2023-06-09 NOTE — Telephone Encounter (Signed)
 Requested Prescriptions  Pending Prescriptions Disp Refills   verapamil (CALAN-SR) 120 MG CR tablet [Pharmacy Med Name: VERAPAMIL HCL ER 120 MG TAB] 90 tablet 0    Sig: TAKE ONE TABLET BY MOUTH DAILY     Cardiovascular: Calcium Channel Blockers 3 Failed - 06/09/2023 12:40 PM      Failed - Cr in normal range and within 360 days    Creat  Date Value Ref Range Status  02/08/2017 0.97 0.50 - 0.99 mg/dL Final    Comment:    For patients >67 years of age, the reference limit for Creatinine is approximately 13% higher for people identified as African-American. .    Creatinine, Ser  Date Value Ref Range Status  03/22/2023 1.01 (H) 0.57 - 1.00 mg/dL Final         Passed - ALT in normal range and within 360 days    ALT  Date Value Ref Range Status  03/22/2023 19 0 - 32 IU/L Final         Passed - AST in normal range and within 360 days    AST  Date Value Ref Range Status  03/22/2023 23 0 - 40 IU/L Final         Passed - Last BP in normal range    BP Readings from Last 1 Encounters:  03/22/23 124/80         Passed - Last Heart Rate in normal range    Pulse Readings from Last 1 Encounters:  03/22/23 90         Passed - Valid encounter within last 6 months    Recent Outpatient Visits           2 months ago Encounter for annual physical exam   [place] [facility] [place], Marzella Schlein, MD   6 months ago Hypertension associated with diabetes [facility])   [place] [facility] [place], Marzella Schlein, MD   10 months ago Cyst of left breast   [facility] [place], Marzella Schlein, MD   1 year ago Type 2 diabetes mellitus with other specified complication, without long-term current use of insulin [facility])   [place] [facility] [place], Marzella Schlein, MD   1 year ago [facility] to [facility] preventive visit    [facility] [place], Marzella Schlein, MD       Future Appointments             In 3  months Bacigalupo, Marzella Schlein, MD [facility], [facility]

## 2023-06-24 ENCOUNTER — Emergency Department
Admission: EM | Admit: 2023-06-24 | Discharge: 2023-06-24 | Disposition: A | Discharge status: Home / Self Care | Attending: Emergency Medicine | Admitting: Emergency Medicine

## 2023-06-24 ENCOUNTER — Encounter: Payer: Self-pay | Admitting: Intensive Care

## 2023-06-24 ENCOUNTER — Other Ambulatory Visit: Payer: Self-pay

## 2023-06-24 ENCOUNTER — Telehealth: Payer: Self-pay

## 2023-06-24 ENCOUNTER — Emergency Department

## 2023-06-24 DIAGNOSIS — H9203 Otalgia, bilateral: Secondary | ICD-10-CM | POA: Diagnosis not present

## 2023-06-24 DIAGNOSIS — J3800 Paralysis of vocal cords and larynx, unspecified: Secondary | ICD-10-CM | POA: Insufficient documentation

## 2023-06-24 DIAGNOSIS — E119 Type 2 diabetes mellitus without complications: Secondary | ICD-10-CM | POA: Diagnosis not present

## 2023-06-24 DIAGNOSIS — R4781 Slurred speech: Secondary | ICD-10-CM | POA: Diagnosis not present

## 2023-06-24 DIAGNOSIS — J3801 Paralysis of vocal cords and larynx, unilateral: Secondary | ICD-10-CM | POA: Diagnosis not present

## 2023-06-24 DIAGNOSIS — I10 Essential (primary) hypertension: Secondary | ICD-10-CM | POA: Insufficient documentation

## 2023-06-24 DIAGNOSIS — R221 Localized swelling, mass and lump, neck: Secondary | ICD-10-CM | POA: Diagnosis not present

## 2023-06-24 DIAGNOSIS — R4701 Aphasia: Secondary | ICD-10-CM | POA: Diagnosis not present

## 2023-06-24 DIAGNOSIS — H6123 Impacted cerumen, bilateral: Secondary | ICD-10-CM | POA: Diagnosis not present

## 2023-06-24 DIAGNOSIS — M5032 Other cervical disc degeneration, mid-cervical region, unspecified level: Secondary | ICD-10-CM | POA: Diagnosis not present

## 2023-06-24 LAB — URINALYSIS, ROUTINE W REFLEX MICROSCOPIC
Bilirubin Urine: NEGATIVE
Glucose, UA: NEGATIVE mg/dL
Hgb urine dipstick: NEGATIVE
Ketones, ur: NEGATIVE mg/dL
Leukocytes,Ua: NEGATIVE
Nitrite: NEGATIVE
Protein, ur: NEGATIVE mg/dL
Specific Gravity, Urine: 1.019 (ref 1.005–1.030)
pH: 8 (ref 5.0–8.0)

## 2023-06-24 LAB — COMPREHENSIVE METABOLIC PANEL
ALT: 18 U/L (ref 0–44)
AST: 23 U/L (ref 15–41)
Albumin: 4.8 g/dL (ref 3.5–5.0)
Alkaline Phosphatase: 56 U/L (ref 38–126)
Anion gap: 8 (ref 5–15)
BUN: 13 mg/dL (ref 8–23)
CO2: 26 mmol/L (ref 22–32)
Calcium: 10.5 mg/dL — ABNORMAL HIGH (ref 8.9–10.3)
Chloride: 104 mmol/L (ref 98–111)
Creatinine, Ser: 0.83 mg/dL (ref 0.44–1.00)
GFR, Estimated: >60 mL/min (ref >60)
Glucose, Bld: 146 mg/dL — ABNORMAL HIGH (ref 70–99)
Potassium: 4.1 mmol/L (ref 3.5–5.1)
Sodium: 138 mmol/L (ref 135–145)
Total Bilirubin: 0.8 mg/dL (ref 0.0–1.2)
Total Protein: 7.8 g/dL (ref 6.5–8.1)

## 2023-06-24 LAB — TROPONIN I (HIGH SENSITIVITY)
Troponin I (High Sensitivity): 5 ng/L (ref <18)
Troponin I (High Sensitivity): 5 ng/L (ref <18)

## 2023-06-24 LAB — CBC WITH DIFFERENTIAL/PLATELET
Abs Immature Granulocytes: 0.03 10*3/uL (ref 0.00–0.07)
Basophils Absolute: 0 10*3/uL (ref 0.0–0.1)
Basophils Relative: 0 %
Eosinophils Absolute: 0.1 10*3/uL (ref 0.0–0.5)
Eosinophils Relative: 1 %
HCT: 48.6 % — ABNORMAL HIGH (ref 36.0–46.0)
Hemoglobin: 16 g/dL — ABNORMAL HIGH (ref 12.0–15.0)
Immature Granulocytes: 0 %
Lymphocytes Relative: 22 %
Lymphs Abs: 2.1 10*3/uL (ref 0.7–4.0)
MCH: 28.6 pg (ref 26.0–34.0)
MCHC: 32.9 g/dL (ref 30.0–36.0)
MCV: 86.9 fL (ref 80.0–100.0)
Monocytes Absolute: 0.5 10*3/uL (ref 0.1–1.0)
Monocytes Relative: 5 %
Neutro Abs: 6.9 10*3/uL (ref 1.7–7.7)
Neutrophils Relative %: 72 %
Platelets: 214 10*3/uL (ref 150–400)
RBC: 5.59 MIL/uL — ABNORMAL HIGH (ref 3.87–5.11)
RDW: 13.2 % (ref 11.5–15.5)
WBC: 9.6 10*3/uL (ref 4.0–10.5)
nRBC: 0 % (ref 0.0–0.2)

## 2023-06-24 LAB — T4, FREE: Free T4: 0.96 ng/dL (ref 0.61–1.12)

## 2023-06-24 LAB — TSH: TSH: 1.281 u[IU]/mL (ref 0.350–4.500)

## 2023-06-24 MED ORDER — IOHEXOL 300 MG/ML  SOLN
75.0000 mL | Freq: Once | INTRAMUSCULAR | Status: AC | PRN
Start: 2023-06-24 — End: 2023-06-24
  Administered 2023-06-24: 75 mL via INTRAVENOUS

## 2023-06-24 MED ORDER — PREDNISONE 10 MG PO TABS: ORAL_TABLET | ORAL | 0 refills | Status: AC

## 2023-06-24 MED ORDER — MIDAZOLAM HCL 2 MG/2ML IJ SOLN
2.0000 mg | Freq: Once | INTRAMUSCULAR | Status: DC | PRN
Start: 2023-06-24 — End: 2023-06-24

## 2023-06-24 NOTE — Discharge Instructions (Signed)
 Please call your ENT team to set up follow-up appointment for continued outpatient monitoring.  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  I have sent prednisone to your pharmacy which may help with this.  Please have your sugars checked over the next several days as prednisone can make your blood sugar go high.

## 2023-06-24 NOTE — ED Triage Notes (Signed)
 Patient presents with slurred speech that she has had since January after a fall. Reports the slurred speech has gotten worse and bilateral hand weakness started a few weeks ago.  Orders placed per provider, CariB NP

## 2023-06-24 NOTE — ED Provider Notes (Signed)
 [facility] Provider Note    Event Date/Time   First MD Initiated Contact with Patient 06/24/23 1254     (approximate)   History   Aphasia   HPI Monica Hoffman is a 68 y.o. female with history of DM2, HTN, HLD presenting today for slurred speech.  Patient has reportedly had slurred speech for at least 3 months since a fall with injury to her jaw.  She has gotten outpatient MRI which was negative for CVA.  She also saw ENT today over concerns of possible injury to a nerve in her face.  They reportedly performed a procedure looking at the top of her vocal cords and saw nothing concerning.  She came here for further evaluation.  She feels like she has a lump underneath the left side of her jaw.  Thinks it may be nerve injury related.  Otherwise denies any difficulty swallowing, breathing.  No pain symptoms.  No new falls or head injury.     Physical Exam   Triage Vital Signs: ED Triage Vitals  Encounter Vitals Group     BP 06/24/23 1139 (!) 120/108     Systolic BP Percentile --      Diastolic BP Percentile --      Pulse Rate 06/24/23 1139 86     Resp 06/24/23 1139 18     Temp 06/24/23 1139 98.9 F (37.2 C)     Temp Source 06/24/23 1139 Oral     SpO2 06/24/23 1139 98 %     Weight 06/24/23 1140 196 lb (88.9 kg)     Height 06/24/23 1140 5\' 9"  (1.753 m)     Head Circumference --      Peak Flow --      Pain Score 06/24/23 1140 0     Pain Loc --      Pain Education --      Exclude from Growth Chart --     Most recent vital signs: Vitals:   06/24/23 1139 06/24/23 1510  BP: (!) 120/108 (!) 151/96  Pulse: 86 81  Resp: 18   Temp: 98.9 F (37.2 C) 98.5 F (36.9 C)  SpO2: 98% 96%   Physical Exam: I have reviewed the vital signs and nursing notes. General: Awake, alert, no acute distress.  Nontoxic appearing. Head:  Atraumatic, normocephalic.   ENT:  EOM intact, PERRL. Oral mucosa is pink and moist with no lesions. Neck: Neck is supple with full  range of motion, No meningeal signs. Cardiovascular:  RRR, No murmurs. Peripheral pulses palpable and equal bilaterally. Respiratory:  Symmetrical chest wall expansion.  No rhonchi, rales, or wheezes.  Good air movement throughout.  No use of accessory muscles.   Musculoskeletal:  No cyanosis or edema. Moving extremities with full ROM Abdomen:  Soft, nontender, nondistended. Neuro:  GCS 15, moving all four extremities, interacting appropriately. Speech slurred but consistent with her baseline for months.  5 out of 5 muscle strength equal and intact throughout bilateral upper and lower extremities.  Sensation equal and intact throughout bilateral upper and lower extremities.  Cranial nerves II through XII outside of slurred speech otherwise unremarkable. Psych:  Calm, appropriate.   Skin:  Warm, dry, no rash.    ED Results / Procedures / Treatments   Labs (all labs ordered are listed, but only abnormal results are displayed) Labs Reviewed  COMPREHENSIVE METABOLIC PANEL - Abnormal; Notable for the following components:      Result Value   Glucose, Bld 146 (*)  Calcium 10.5 (*)    All other components within normal limits  URINALYSIS, ROUTINE W REFLEX MICROSCOPIC - Abnormal; Notable for the following components:   Color, Urine COLORLESS (*)    APPearance CLEAR (*)    All other components within normal limits  CBC WITH DIFFERENTIAL/PLATELET - Abnormal; Notable for the following components:   RBC 5.59 (*)    Hemoglobin 16.0 (*)    HCT 48.6 (*)    All other components within normal limits  T4, FREE  TSH  TROPONIN I (HIGH SENSITIVITY)  TROPONIN I (HIGH SENSITIVITY)     EKG    RADIOLOGY Independently interpreted CT soft tissue neck with evidence of right sided vocal paresis   PROCEDURES:  Critical Care performed: No  Procedures   MEDICATIONS ORDERED IN ED: Medications  midazolam (VERSED) injection 2 mg (has no administration in time range)  iohexol (OMNIPAQUE) 300  MG/ML solution 75 mL (75 mLs Intravenous Contrast Given 06/24/23 1359)     IMPRESSION / MDM / ASSESSMENT AND PLAN / ED COURSE  I reviewed the triage vital signs and the nursing notes.                              Differential diagnosis includes, but is not limited to, hypothyroidism, nerve injury, soft tissue mass of the neck, electrolyte abnormality  Patient's presentation is most consistent with acute complicated illness / injury requiring diagnostic workup.  Patient is a 68 year old female presenting today for chronic slurred speech as well as possible swelling under her left neck.  She has already received outpatient MRI which was negative for stroke.  She also saw ENT who had no acute findings on their exam today which included visualization of her cords and upper airway.  She feels like she has a spot underneath her left mandible with swelling which is slightly palpated here.  Will get CT soft tissue of the neck for further evaluation.  No indication for repeat imaging of the head as speech symptoms are unchanged today with no new neurological findings.  Patient is agreeable with that.  Will also evaluate thyroid labs as a potential culprit of slurred speech and weakness.  Thyroid labs normal.  CT soft tissue of neck shows evidence of right-sided vocal cord paresis.  Discussed case with ENT and given chronicity no immediate intervention indicated.  Recommend starting on prednisone taper.  Follow-up with ENT in 1 week.  Recommend patient keep track of her blood sugar at home given recent A1c of 6.6.  Patient otherwise stable for discharge and given strict return precautions.  The patient is on the cardiac monitor to evaluate for evidence of arrhythmia and/or significant heart rate changes. Clinical Course as of 06/24/23 1532  Thu Jun 24, 2023  1524 CT Soft Tissue Neck W Contrast ENT - follow up outpatient. No indications for any immediate intervention. Start on prednisone taper until follow up  [DW]    Clinical Course User Index [DW] Janith Lima, MD     FINAL CLINICAL IMPRESSION(S) / ED DIAGNOSES   Final diagnoses:  Vocal cord paresis     Rx / DC Orders   ED Discharge Orders          Ordered    predniSONE (DELTASONE) 10 MG tablet  Daily        06/24/23 1531             Note:  This document was prepared using Dragon  voice recognition software and may include unintentional dictation errors.   Janith Lima, MD 06/24/23 [phone]

## 2023-06-24 NOTE — ED Provider Triage Note (Signed)
 Emergency Medicine Provider Triage Evaluation Note  Monica Hoffman , a 68 y.o. female  was evaluated in triage.  Pt complains of slurred speech x 3 months after falling "hard" against an outbuilding. She has been evaluated for CVA and examined by ENT for possible nerve injury to her face and looked at her vocal cords. No new injury. No weakness in extremities. She feels a tender area under the left jaw that feels swollen. She denies dental pain or feeling of malocclusion.  Physical Exam  There were no vitals taken for this visit. Gen:   Awake, no distress   Resp:  Normal effort  MSK:   Moves extremities without difficulty  Other:    Medical Decision Making  Medically screening exam initiated at 11:37 AM.  Appropriate orders placed.  Monica Hoffman was informed that the remainder of the evaluation will be completed by another provider, this initial triage assessment does not replace that evaluation, and the importance of remaining in the ED until their evaluation is complete.     Chinita Pester, FNP 06/25/23 1129

## 2023-06-24 NOTE — Telephone Encounter (Signed)
 Copied from CRM [id number]. Topic: General - Other >> Jun 24, 2023 10:39 AM Geroge Baseman wrote: Reason for CRM: Dr. Jenne Campus from ENT called to speak with Dr. Leonard Schwartz. CAL advised she is out of the office for the next 2 weeks. Dr. Jenne Campus would like a call back from a nurse or doctor who would be familiar with this patient. Callback number is [phone].

## 2023-06-24 NOTE — ED Notes (Signed)
 See triage notes. Patient stated she believes she has a salivary gland that is blocked in the left side of her neck. The patient's husband believes her slurred speech is worse than normal.

## 2023-06-25 DIAGNOSIS — R4701 Aphasia: Secondary | ICD-10-CM | POA: Diagnosis not present

## 2023-06-29 ENCOUNTER — Other Ambulatory Visit: Payer: Self-pay | Admitting: Family Medicine

## 2023-06-29 DIAGNOSIS — Z1231 Encounter for screening mammogram for malignant neoplasm of breast: Secondary | ICD-10-CM

## 2023-07-01 NOTE — Telephone Encounter (Signed)
 Spoke to Dr Jenne Campus.  Patient's aphasia seems to be progressing. She is plugged in with Dr Sherryll Burger (Neuro) and has an appt schedule 4/10.  Can someone reach out and just encourage her ot attend that appt with Dr Sherryll Burger. Let her know that Dr Jenne Campus and I spoke and agree that she really needs to see Neurology for this speech difficulty.

## 2023-07-01 NOTE — Telephone Encounter (Signed)
 Left voice message for patient to call back. Ok to advise

## 2023-07-02 ENCOUNTER — Telehealth: Payer: Self-pay

## 2023-07-02 NOTE — Telephone Encounter (Signed)
 Copied from CRM [id number]. Topic: Clinical - Medical Advice >> Jul 02, 2023  9:08 AM Marland Kitchen D wrote: Patient was given the message but she says she know about it please call patient back with the information you are wanting her to know.

## 2023-07-02 NOTE — Telephone Encounter (Signed)
 LMTCB-ok for E2C2 to give message to patient.

## 2023-07-12 NOTE — Telephone Encounter (Signed)
 Please see note from 03/28-patient was given message.

## 2023-07-15 ENCOUNTER — Ambulatory Visit: Payer: Self-pay

## 2023-07-15 DIAGNOSIS — G1221 Amyotrophic lateral sclerosis: Secondary | ICD-10-CM | POA: Diagnosis not present

## 2023-07-15 DIAGNOSIS — E1129 Type 2 diabetes mellitus with other diabetic kidney complication: Secondary | ICD-10-CM | POA: Diagnosis not present

## 2023-07-15 DIAGNOSIS — F482 Pseudobulbar affect: Secondary | ICD-10-CM | POA: Diagnosis not present

## 2023-07-15 DIAGNOSIS — R809 Proteinuria, unspecified: Secondary | ICD-10-CM | POA: Diagnosis not present

## 2023-07-15 DIAGNOSIS — R296 Repeated falls: Secondary | ICD-10-CM | POA: Diagnosis not present

## 2023-07-15 LAB — HEMOGLOBIN A1C: Hemoglobin A1C: 6.7

## 2023-07-15 NOTE — Telephone Encounter (Signed)
 She just saw Dr Sherryll Burger (Neuro) for this on 4/2. Note isnt completed yet, so can't see A/P, but Dr Jenne Campus and I spoke about the concerns with the patient a few weeks ago and we tried to reach out to her then.  Ok to call her and tell her that I want to see her and schedule her for an appt.

## 2023-07-15 NOTE — Telephone Encounter (Signed)
 Detailed voicemail left per dpr. Ok for e2c2 to schedule appointment if call is returned
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
## 2023-07-17 ENCOUNTER — Other Ambulatory Visit: Payer: Self-pay | Admitting: Family Medicine

## 2023-07-19 ENCOUNTER — Ambulatory Visit
Admission: RE | Admit: 2023-07-19 | Discharge: 2023-07-19 | Disposition: A | Discharge status: Home / Self Care | Attending: Neurology | Admitting: Neurology

## 2023-07-19 ENCOUNTER — Other Ambulatory Visit: Payer: Self-pay | Admitting: Neurology

## 2023-07-19 DIAGNOSIS — R531 Weakness: Secondary | ICD-10-CM | POA: Diagnosis not present

## 2023-07-19 DIAGNOSIS — R296 Repeated falls: Secondary | ICD-10-CM

## 2023-07-19 NOTE — Telephone Encounter (Signed)
 Requested Prescriptions  Pending Prescriptions Disp Refills   rosuvastatin (CRESTOR) 5 MG tablet [Pharmacy Med Name: ROSUVASTATIN CALCIUM 5 MG TAB] 90 tablet 0    Sig: TAKE 1 TABLET BY MOUTH ONCE DAILY     Cardiovascular:  Antilipid - Statins 2 Failed - 07/19/2023  2:00 PM      Failed - Valid encounter within last 12 months    Recent Outpatient Visits   None     Future Appointments             In 2 months Bacigalupo, Stan Eans, MD [facility]            Failed - Lipid Panel in normal range within the last 12 months    Cholesterol, Total  Date Value Ref Range Status  03/22/2023 172 100 - 199 mg/dL Final   LDL Cholesterol (Calc)  Date Value Ref Range Status  02/08/2017 159 (H) mg/dL (calc) Final    Comment:    Reference range: <100 . Desirable range <100 mg/dL for primary prevention;   <70 mg/dL for patients with CHD or diabetic patients  with > or = 2 CHD risk factors. Aaron Aas LDL-C is now calculated using the Martin-Hopkins  calculation, which is a validated novel method providing  better accuracy than the Friedewald equation in the  estimation of LDL-C.  Melinda Sprawls et al. Erroll Heard. 4098;119(14): 2061-2068  (http://education.[employer].com/faq/FAQ164)    LDL Chol Calc (NIH)  Date Value Ref Range Status  03/22/2023 93 0 - 99 mg/dL Final   HDL  Date Value Ref Range Status  03/22/2023 57 >39 mg/dL Final   Triglycerides  Date Value Ref Range Status  03/22/2023 126 0 - 149 mg/dL Final         Passed - Cr in normal range and within 360 days    Creat  Date Value Ref Range Status  02/08/2017 0.97 0.50 - 0.99 mg/dL Final    Comment:    For patients >79 years of age, the reference limit for Creatinine is approximately 13% higher for people identified as African-American. .    Creatinine, Ser  Date Value Ref Range Status  06/24/2023 0.83 0.44 - 1.00 mg/dL Final         Passed - Patient is not pregnant

## 2023-07-20 DIAGNOSIS — G1221 Amyotrophic lateral sclerosis: Secondary | ICD-10-CM | POA: Diagnosis not present

## 2023-07-22 ENCOUNTER — Telehealth: Payer: Self-pay

## 2023-07-22 NOTE — Telephone Encounter (Signed)
 Called and left a voice message, asking the pt to contact the office back. Received a message stating the pt was returning a call, after reviewing the chart there was know documentation of a call being placed form this office. Ok to seek what the pt need.

## 2023-07-22 NOTE — Telephone Encounter (Signed)
 I am only sending this to you because I can't tell what the call was regarding. It looks like you called her about 5:30 last night but I don't know why  Copied from CRM [id number]. Topic: General - Other >> Jul 21, 2023  3:04 PM [name] wrote: Reason for CRM: pt called in because she stated that the dr has been trying to get in touch with her but she hasn't been able to get to the phone . She said she is ready to take the call now if the nurse or the dr would like to give her a call .

## 2023-07-26 DIAGNOSIS — G1221 Amyotrophic lateral sclerosis: Secondary | ICD-10-CM | POA: Diagnosis not present

## 2023-07-27 ENCOUNTER — Ambulatory Visit
Admission: RE | Admit: 2023-07-27 | Discharge: 2023-07-27 | Disposition: A | Discharge status: Home / Self Care | Source: Ambulatory Visit | Attending: Family Medicine | Admitting: Family Medicine

## 2023-07-27 DIAGNOSIS — Z1231 Encounter for screening mammogram for malignant neoplasm of breast: Secondary | ICD-10-CM | POA: Diagnosis not present

## 2023-08-02 ENCOUNTER — Other Ambulatory Visit: Payer: Self-pay | Admitting: Family Medicine

## 2023-08-02 DIAGNOSIS — R928 Other abnormal and inconclusive findings on diagnostic imaging of breast: Secondary | ICD-10-CM

## 2023-08-05 ENCOUNTER — Ambulatory Visit
Admission: RE | Admit: 2023-08-05 | Discharge: 2023-08-05 | Disposition: A | Discharge status: Home / Self Care | Source: Ambulatory Visit | Attending: Family Medicine | Admitting: Family Medicine

## 2023-08-05 DIAGNOSIS — N6321 Unspecified lump in the left breast, upper outer quadrant: Secondary | ICD-10-CM | POA: Diagnosis not present

## 2023-08-05 DIAGNOSIS — R928 Other abnormal and inconclusive findings on diagnostic imaging of breast: Secondary | ICD-10-CM | POA: Diagnosis not present

## 2023-08-05 DIAGNOSIS — R92333 Mammographic heterogeneous density, bilateral breasts: Secondary | ICD-10-CM | POA: Diagnosis not present

## 2023-08-05 DIAGNOSIS — N6315 Unspecified lump in the right breast, overlapping quadrants: Secondary | ICD-10-CM | POA: Diagnosis not present

## 2023-08-25 ENCOUNTER — Other Ambulatory Visit: Payer: Self-pay | Admitting: Family Medicine

## 2023-09-02 ENCOUNTER — Other Ambulatory Visit: Payer: Self-pay | Admitting: Family Medicine

## 2023-09-13 DIAGNOSIS — G1221 Amyotrophic lateral sclerosis: Secondary | ICD-10-CM | POA: Diagnosis not present

## 2023-09-14 DIAGNOSIS — M6281 Muscle weakness (generalized): Secondary | ICD-10-CM | POA: Diagnosis not present

## 2023-09-14 DIAGNOSIS — Z7189 Other specified counseling: Secondary | ICD-10-CM | POA: Diagnosis not present

## 2023-09-14 DIAGNOSIS — Z6827 Body mass index (BMI) 27.0-27.9, adult: Secondary | ICD-10-CM | POA: Diagnosis not present

## 2023-09-14 DIAGNOSIS — G1221 Amyotrophic lateral sclerosis: Secondary | ICD-10-CM | POA: Diagnosis not present

## 2023-09-14 DIAGNOSIS — R634 Abnormal weight loss: Secondary | ICD-10-CM | POA: Diagnosis not present

## 2023-09-14 DIAGNOSIS — R1312 Dysphagia, oropharyngeal phase: Secondary | ICD-10-CM | POA: Diagnosis not present

## 2023-09-14 DIAGNOSIS — J9612 Chronic respiratory failure with hypercapnia: Secondary | ICD-10-CM | POA: Diagnosis not present

## 2023-09-14 DIAGNOSIS — R471 Dysarthria and anarthria: Secondary | ICD-10-CM | POA: Diagnosis not present

## 2023-09-20 ENCOUNTER — Ambulatory Visit: Payer: Self-pay | Admitting: Family Medicine

## 2023-09-20 ENCOUNTER — Encounter: Payer: Self-pay | Admitting: Family Medicine

## 2023-09-20 VITALS — BP 127/83 | HR 107 | Ht 69.0 in | Wt 183.0 lb

## 2023-09-20 DIAGNOSIS — R809 Proteinuria, unspecified: Secondary | ICD-10-CM

## 2023-09-20 DIAGNOSIS — E1169 Type 2 diabetes mellitus with other specified complication: Secondary | ICD-10-CM | POA: Diagnosis not present

## 2023-09-20 DIAGNOSIS — G1221 Amyotrophic lateral sclerosis: Secondary | ICD-10-CM | POA: Diagnosis not present

## 2023-09-20 DIAGNOSIS — E1159 Type 2 diabetes mellitus with other circulatory complications: Secondary | ICD-10-CM

## 2023-09-20 DIAGNOSIS — E1129 Type 2 diabetes mellitus with other diabetic kidney complication: Secondary | ICD-10-CM

## 2023-09-20 DIAGNOSIS — J9612 Chronic respiratory failure with hypercapnia: Secondary | ICD-10-CM | POA: Diagnosis not present

## 2023-09-20 DIAGNOSIS — I152 Hypertension secondary to endocrine disorders: Secondary | ICD-10-CM | POA: Diagnosis not present

## 2023-09-20 DIAGNOSIS — E785 Hyperlipidemia, unspecified: Secondary | ICD-10-CM

## 2023-09-20 NOTE — Assessment & Plan Note (Signed)
 Diagnosis confirmed by neurologist. Symptoms include difficulty with cognitive tasks such as reciting months backwards and Upper and lower motor neuron symptoms. She is engaged with the [facility] for comprehensive care. - Continue follow-up with neurology for management. - Ensure living will and healthcare power of attorney are up to date and on record.

## 2023-09-20 NOTE — Assessment & Plan Note (Signed)
 No longer on lisinopril  - blames it for muscle pain Recheck UACR annually

## 2023-09-20 NOTE — Progress Notes (Signed)
 Established patient visit   Patient: Monica Hoffman   DOB: Apr 02, 1956   68 y.o. Female  MRN: [id number] Visit Date: 09/20/2023  Today's healthcare provider: Aden Agreste, MD   Chief Complaint  Patient presents with   Medical Management of Chronic Issues    6 months follow-up HTN, Hyperlipidemia and T2DM.    Subjective    HPI HPI     Medical Management of Chronic Issues    Additional comments: 6 months follow-up HTN, Hyperlipidemia and T2DM.       Last edited by Rosas, Joseline E, CMA on 09/20/2023  8:21 AM.       Discussed the use of AI scribe software for clinical note transcription with the patient, who gave verbal consent to proceed.  History of Present Illness   Monica Hoffman is a 68 year old female with a history of falls and neurological symptoms who presents for follow-up on her condition.  Over the past year, she has experienced multiple falls, including incidents in January, February, and October, resulting in injuries to her right foot, left elbow, hip, and rhomboid. She has slurred speech and was evaluated for a possible stroke, but an MRI ruled it out. A CT scan of her neck led to diagnoses of aphasia and vocal cord damage. She was initially evaluated for ALS and was later also diagnosed with frontal lobe dementia at the [facility] clinic. She struggles with cognitive tasks, such as reciting months backwards.  She experiences worsening muscle twitching, previously associated with Crestor  and lisinopril , which she has discontinued. She continues to take verapamil  and riluzole. Her diabetes and hypertension are managed, with recent satisfactory blood pressure readings and normal A1c levels. She is due for cholesterol, kidney, and liver function tests.  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 She is arranging her healthcare power of attorney and living will.         Medications: Outpatient Medications Prior to Visit  Medication  Sig   aspirin 81 MG tablet    meloxicam  (MOBIC ) 7.5 MG tablet Take 1 tablet (7.5 mg total) by mouth every 12 (twelve) hours.   riluzole (RILUTEK) 50 MG tablet Take 50 mg by mouth.   verapamil  (CALAN -SR) 120 MG CR tablet TAKE ONE TABLET BY MOUTH DAILY   verapamil  (CALAN -SR) 240 MG CR tablet TAKE ONE TABLET BY MOUTH AT BEDTIME   [DISCONTINUED] verapamil  (CALAN -SR) 240 MG CR tablet TAKE ONE TABLET BY MOUTH AT BEDTIME   [DISCONTINUED] verapamil  (CALAN -SR) 240 MG CR tablet Take 1 tablet (240 mg total) by mouth at bedtime.   [DISCONTINUED] verapamil  (CALAN -SR) 240 MG CR tablet TAKE ONE TABLET BY MOUTH AT BEDTIME   [DISCONTINUED] clonazePAM  (KLONOPIN ) 0.5 MG tablet TAKE ONE-HALF TO ONE TABLET BY MOUTH TWICE DAILY AS NEEDED FOR ANXIETY (Patient not taking: Reported on 09/20/2023)   [DISCONTINUED] lisinopril  (ZESTRIL ) 5 MG tablet TAKE 1 TABLET BY MOUTH DAILY (Patient not taking: Reported on 09/20/2023)   [DISCONTINUED] rosuvastatin  (CRESTOR ) 5 MG tablet TAKE 1 TABLET BY MOUTH ONCE DAILY (Patient not taking: Reported on 09/20/2023)   No facility-administered medications prior to visit.    Review of Systems     Objective    BP 127/83 (BP Location: Left Arm, Patient Position: Sitting, Cuff Size: Large)   Pulse (!) 107   Ht 5' 9 (1.753 m)   Wt 183 lb (83 kg)   SpO2 97%   BMI 27.02 kg/m    Physical Exam  Vitals reviewed.  Constitutional:      General: She is not in acute distress.    Appearance: Normal appearance. She is well-developed. She is not diaphoretic.  HENT:     Head: Normocephalic and atraumatic.   Eyes:     General: No scleral icterus.    Conjunctiva/sclera: Conjunctivae normal.   Neck:     Thyroid : No thyromegaly.   Cardiovascular:     Rate and Rhythm: Normal rate and regular rhythm.     Heart sounds: Normal heart sounds. No murmur heard. Pulmonary:     Effort: Pulmonary effort is normal. No respiratory distress.     Breath sounds: Normal breath sounds. No wheezing,  rhonchi or rales.   Musculoskeletal:     Cervical back: Neck supple.     Right lower leg: No edema.     Left lower leg: No edema.  Lymphadenopathy:     Cervical: No cervical adenopathy.   Skin:    General: Skin is warm and dry.     Findings: No rash.   Neurological:     Mental Status: She is alert.   Psychiatric:     Comments: Tearful affect      Results for orders placed or performed in visit on 09/20/23  Hemoglobin A1c  Result Value Ref Range   Hemoglobin A1C 6.7     Assessment & Plan     Problem List Items Addressed This Visit       Cardiovascular and Mediastinum   Hypertension associated with diabetes (HCC)   Blood pressure is well-controlled on current regimen. She self-discontinued Crestor  and lisinopril  due to muscle twitching, but continues verapamil . - Continue verapamil  240 mg at night and 120 mg in the morning.      Relevant Orders   Comprehensive metabolic panel with GFR     Endocrine   Hyperlipidemia associated with type 2 diabetes mellitus (HCC)   Previously well controlled No longer on statin - stopped 2/2 muscle pain Repeat FLP and CMP Goal LDL < 70       Relevant Orders   Comprehensive metabolic panel with GFR   Lipid panel   T2DM (type 2 diabetes mellitus) (HCC) - Primary   Recent A1c checked by Dr. Bernetta Brilliant was satisfactory. - Continue current management plan. - Perform foot exam to assess for diabetic neuropathy.      Microalbuminuria due to type 2 diabetes mellitus (HCC)   No longer on lisinopril  - blames it for muscle pain Recheck UACR annually        Nervous and Auditory   ALS (amyotrophic lateral sclerosis) (HCC)   Diagnosis confirmed by neurologist. Symptoms include difficulty with cognitive tasks such as reciting months backwards and Upper and lower motor neuron symptoms. She is engaged with the [facility] clinic for comprehensive care. - Continue follow-up with neurology for management. - Ensure living will and healthcare power  of attorney are up to date and on record.          Numbness in right foot Numbness persists since January after dropping a gallon of paint on it, noted on top of the foot. - Perform foot exam to assess for any further complications.       Return in about 6 months (around 03/21/2024) for CPE.       Aden Agreste, MD  [facility] [phone] (phone) [phone] (fax)  [facility]

## 2023-09-20 NOTE — Assessment & Plan Note (Signed)
 Previously well controlled No longer on statin - stopped 2/2 muscle pain Repeat FLP and CMP Goal LDL < 70

## 2023-09-20 NOTE — Assessment & Plan Note (Signed)
 Blood pressure is well-controlled on current regimen. She self-discontinued Crestor  and lisinopril  due to muscle twitching, but continues verapamil . - Continue verapamil  240 mg at night and 120 mg in the morning.

## 2023-09-20 NOTE — Assessment & Plan Note (Signed)
 Recent A1c checked by Dr. Bernetta Brilliant was satisfactory. - Continue current management plan. - Perform foot exam to assess for diabetic neuropathy.

## 2023-09-21 ENCOUNTER — Ambulatory Visit: Payer: Self-pay | Admitting: Family Medicine

## 2023-09-21 DIAGNOSIS — E538 Deficiency of other specified B group vitamins: Secondary | ICD-10-CM | POA: Diagnosis not present

## 2023-09-21 DIAGNOSIS — R531 Weakness: Secondary | ICD-10-CM | POA: Diagnosis not present

## 2023-09-21 DIAGNOSIS — E559 Vitamin D deficiency, unspecified: Secondary | ICD-10-CM | POA: Diagnosis not present

## 2023-09-21 DIAGNOSIS — R1310 Dysphagia, unspecified: Secondary | ICD-10-CM | POA: Diagnosis not present

## 2023-09-21 DIAGNOSIS — Z1331 Encounter for screening for depression: Secondary | ICD-10-CM | POA: Diagnosis not present

## 2023-09-21 DIAGNOSIS — G1221 Amyotrophic lateral sclerosis: Secondary | ICD-10-CM | POA: Diagnosis not present

## 2023-09-21 DIAGNOSIS — R296 Repeated falls: Secondary | ICD-10-CM | POA: Diagnosis not present

## 2023-09-21 LAB — LIPID PANEL
Chol/HDL Ratio: 4.9 ratio — ABNORMAL HIGH (ref 0.0–4.4)
Cholesterol, Total: 232 mg/dL — ABNORMAL HIGH (ref 100–199)
HDL: 47 mg/dL (ref >39)
LDL Chol Calc (NIH): 147 mg/dL — ABNORMAL HIGH (ref 0–99)
Triglycerides: 209 mg/dL — ABNORMAL HIGH (ref 0–149)
VLDL Cholesterol Cal: 38 mg/dL (ref 5–40)

## 2023-09-21 LAB — COMPREHENSIVE METABOLIC PANEL WITH GFR
ALT: 19 IU/L (ref 0–32)
AST: 20 IU/L (ref 0–40)
Albumin: 4.6 g/dL (ref 3.9–4.9)
Alkaline Phosphatase: 64 IU/L (ref 44–121)
BUN/Creatinine Ratio: 19 (ref 12–28)
BUN: 17 mg/dL (ref 8–27)
Bilirubin Total: 0.5 mg/dL (ref 0.0–1.2)
CO2: 22 mmol/L (ref 20–29)
Calcium: 10.3 mg/dL (ref 8.7–10.3)
Chloride: 103 mmol/L (ref 96–106)
Creatinine, Ser: 0.89 mg/dL (ref 0.57–1.00)
Globulin, Total: 2.5 g/dL (ref 1.5–4.5)
Glucose: 138 mg/dL — ABNORMAL HIGH (ref 70–99)
Potassium: 4.4 mmol/L (ref 3.5–5.2)
Sodium: 143 mmol/L (ref 134–144)
Total Protein: 7.1 g/dL (ref 6.0–8.5)
eGFR: 71 mL/min/{1.73_m2} (ref >59)

## 2023-09-23 NOTE — Telephone Encounter (Signed)
 FYI

## 2023-10-04 DIAGNOSIS — G1221 Amyotrophic lateral sclerosis: Secondary | ICD-10-CM | POA: Diagnosis not present

## 2023-10-04 DIAGNOSIS — J9612 Chronic respiratory failure with hypercapnia: Secondary | ICD-10-CM | POA: Diagnosis not present

## 2023-10-19 ENCOUNTER — Other Ambulatory Visit: Payer: Self-pay | Admitting: Family Medicine

## 2023-10-20 DIAGNOSIS — J9612 Chronic respiratory failure with hypercapnia: Secondary | ICD-10-CM | POA: Diagnosis not present

## 2023-10-20 DIAGNOSIS — G1221 Amyotrophic lateral sclerosis: Secondary | ICD-10-CM | POA: Diagnosis not present

## 2023-11-03 DIAGNOSIS — J9612 Chronic respiratory failure with hypercapnia: Secondary | ICD-10-CM | POA: Diagnosis not present

## 2023-11-03 DIAGNOSIS — G1221 Amyotrophic lateral sclerosis: Secondary | ICD-10-CM | POA: Diagnosis not present

## 2023-11-09 ENCOUNTER — Other Ambulatory Visit: Payer: Self-pay | Admitting: Family Medicine

## 2023-11-20 ENCOUNTER — Other Ambulatory Visit: Payer: Self-pay | Admitting: Family Medicine

## 2023-11-20 DIAGNOSIS — G1221 Amyotrophic lateral sclerosis: Secondary | ICD-10-CM | POA: Diagnosis not present

## 2023-11-20 DIAGNOSIS — J9612 Chronic respiratory failure with hypercapnia: Secondary | ICD-10-CM | POA: Diagnosis not present

## 2023-11-23 DIAGNOSIS — G3109 Other frontotemporal dementia: Secondary | ICD-10-CM | POA: Diagnosis not present

## 2023-11-23 DIAGNOSIS — E119 Type 2 diabetes mellitus without complications: Secondary | ICD-10-CM | POA: Diagnosis not present

## 2023-11-23 DIAGNOSIS — I10 Essential (primary) hypertension: Secondary | ICD-10-CM | POA: Diagnosis not present

## 2023-11-23 DIAGNOSIS — G1221 Amyotrophic lateral sclerosis: Secondary | ICD-10-CM | POA: Diagnosis not present

## 2023-11-23 DIAGNOSIS — R4189 Other symptoms and signs involving cognitive functions and awareness: Secondary | ICD-10-CM | POA: Diagnosis not present

## 2023-11-23 DIAGNOSIS — Z515 Encounter for palliative care: Secondary | ICD-10-CM | POA: Diagnosis not present

## 2023-11-23 DIAGNOSIS — J984 Other disorders of lung: Secondary | ICD-10-CM | POA: Diagnosis not present

## 2023-11-25 ENCOUNTER — Other Ambulatory Visit: Payer: Self-pay | Admitting: Family Medicine

## 2023-11-29 ENCOUNTER — Other Ambulatory Visit: Payer: Self-pay | Admitting: Family Medicine

## 2023-12-04 DIAGNOSIS — J9612 Chronic respiratory failure with hypercapnia: Secondary | ICD-10-CM | POA: Diagnosis not present

## 2023-12-04 DIAGNOSIS — G1221 Amyotrophic lateral sclerosis: Secondary | ICD-10-CM | POA: Diagnosis not present

## 2023-12-21 DIAGNOSIS — G1221 Amyotrophic lateral sclerosis: Secondary | ICD-10-CM | POA: Diagnosis not present

## 2023-12-21 DIAGNOSIS — J9612 Chronic respiratory failure with hypercapnia: Secondary | ICD-10-CM | POA: Diagnosis not present

## 2023-12-24 ENCOUNTER — Telehealth: Payer: Self-pay

## 2023-12-24 NOTE — Transitions of Care (Post Inpatient/ED Visit) (Signed)
   12/24/2023  Name: MAYCEE BLASCO MRN: [id number] DOB: 1955-11-01  Today's TOC FU Call Status: Today's TOC FU Call Status:: Unsuccessful Call (1st Attempt) Unsuccessful Call (1st Attempt) Date: 12/24/23  Attempted to reach the patient regarding the most recent Inpatient/ED visit. Left a HIPAA approved voicemail message to phone number provided in demographics per DPR.   Successful call to Hospice: [facility] RN made outreach to verify receipt of hospice referral and start of care. RN confirmed that patient is actively enrolled in hospice program. Hospice Agency: [facility]-  RN spoke with: Hampton Pickerel of Care Date: 12/23/23 Brooks state that patient is at home with Hospice she had some paper work updated and re-admitted to [facility] services - due to administrative paperwork updates) No further interventions at this time.  Follow Up Plan: No further outreach attempts will be made at this time. We have been unable to contact the patient.  Richerd Fish, RN, BSN, CCM [facility], [facility] RN Care Manager Direct Dial: [phone]

## 2024-01-04 DIAGNOSIS — J9612 Chronic respiratory failure with hypercapnia: Secondary | ICD-10-CM | POA: Diagnosis not present

## 2024-01-12 DIAGNOSIS — H2513 Age-related nuclear cataract, bilateral: Secondary | ICD-10-CM | POA: Diagnosis not present

## 2024-01-12 DIAGNOSIS — E119 Type 2 diabetes mellitus without complications: Secondary | ICD-10-CM | POA: Diagnosis not present

## 2024-01-12 DIAGNOSIS — R253 Fasciculation: Secondary | ICD-10-CM | POA: Diagnosis not present

## 2024-01-12 LAB — OPHTHALMOLOGY REPORT-SCANNED

## 2024-01-19 ENCOUNTER — Other Ambulatory Visit: Payer: Self-pay | Admitting: Family Medicine

## 2024-01-20 NOTE — Telephone Encounter (Signed)
 Requested information for exam.

## 2024-03-11 ENCOUNTER — Other Ambulatory Visit: Payer: Self-pay | Admitting: Family Medicine

## 2024-03-23 ENCOUNTER — Encounter: Admitting: Family Medicine

## 2024-04-06 DEATH — deceased
